# Patient Record
Sex: Female | Born: 1935 | Race: White | Hispanic: No | Smoking: Never smoker
Health system: Southern US, Community
[De-identification: ages and names within clinical notes are randomized; demographics above are authoritative.]

## PROBLEM LIST (undated history)

## (undated) DIAGNOSIS — N189 Chronic kidney disease, unspecified: Secondary | ICD-10-CM

## (undated) DIAGNOSIS — H409 Unspecified glaucoma: Secondary | ICD-10-CM

## (undated) DIAGNOSIS — E039 Hypothyroidism, unspecified: Secondary | ICD-10-CM

## (undated) DIAGNOSIS — I2510 Atherosclerotic heart disease of native coronary artery without angina pectoris: Secondary | ICD-10-CM

## (undated) DIAGNOSIS — I10 Essential (primary) hypertension: Secondary | ICD-10-CM

## (undated) DIAGNOSIS — M810 Age-related osteoporosis without current pathological fracture: Secondary | ICD-10-CM

## (undated) DIAGNOSIS — C801 Malignant (primary) neoplasm, unspecified: Secondary | ICD-10-CM

## (undated) DIAGNOSIS — M1990 Unspecified osteoarthritis, unspecified site: Secondary | ICD-10-CM

## (undated) HISTORY — PX: EYE SURGERY: SHX253

## (undated) HISTORY — PX: ABDOMINAL HYSTERECTOMY: SHX81

## (undated) HISTORY — PX: THYROID SURGERY: SHX805

---

## 2005-11-13 ENCOUNTER — Ambulatory Visit: Payer: Self-pay | Admitting: Internal Medicine

## 2006-01-02 ENCOUNTER — Ambulatory Visit: Payer: Self-pay | Admitting: Internal Medicine

## 2006-01-31 ENCOUNTER — Ambulatory Visit: Payer: Self-pay | Admitting: Internal Medicine

## 2006-08-09 ENCOUNTER — Ambulatory Visit: Payer: Self-pay | Admitting: Internal Medicine

## 2007-01-06 ENCOUNTER — Ambulatory Visit: Payer: Self-pay | Admitting: Internal Medicine

## 2008-02-06 ENCOUNTER — Ambulatory Visit: Payer: Self-pay | Admitting: Internal Medicine

## 2008-10-22 HISTORY — PX: CATARACT EXTRACTION W/ INTRAOCULAR LENS IMPLANT: SHX1309

## 2009-02-07 ENCOUNTER — Ambulatory Visit: Payer: Self-pay | Admitting: Internal Medicine

## 2010-02-08 ENCOUNTER — Ambulatory Visit: Payer: Self-pay | Admitting: Internal Medicine

## 2011-02-12 ENCOUNTER — Ambulatory Visit: Payer: Self-pay | Admitting: Internal Medicine

## 2012-02-18 ENCOUNTER — Ambulatory Visit: Payer: Self-pay | Admitting: Internal Medicine

## 2013-06-19 ENCOUNTER — Ambulatory Visit: Payer: Self-pay | Admitting: Internal Medicine

## 2013-10-25 ENCOUNTER — Emergency Department: Payer: Self-pay | Admitting: Emergency Medicine

## 2013-10-25 LAB — BASIC METABOLIC PANEL
Anion Gap: 6 — ABNORMAL LOW (ref 7–16)
BUN: 31 mg/dL — ABNORMAL HIGH (ref 7–18)
Calcium, Total: 9.2 mg/dL (ref 8.5–10.1)
Chloride: 103 mmol/L (ref 98–107)
Co2: 26 mmol/L (ref 21–32)
Creatinine: 1.26 mg/dL (ref 0.60–1.30)
EGFR (African American): 47 — ABNORMAL LOW
EGFR (Non-African Amer.): 41 — ABNORMAL LOW
Glucose: 114 mg/dL — ABNORMAL HIGH (ref 65–99)
Osmolality: 278 (ref 275–301)
Potassium: 3.8 mmol/L (ref 3.5–5.1)
SODIUM: 135 mmol/L — AB (ref 136–145)

## 2013-10-25 LAB — CBC
HCT: 33.1 % — ABNORMAL LOW (ref 35.0–47.0)
HGB: 11.3 g/dL — ABNORMAL LOW (ref 12.0–16.0)
MCH: 31.5 pg (ref 26.0–34.0)
MCHC: 34.3 g/dL (ref 32.0–36.0)
MCV: 92 fL (ref 80–100)
Platelet: 186 10*3/uL (ref 150–440)
RBC: 3.59 10*6/uL — AB (ref 3.80–5.20)
RDW: 13.6 % (ref 11.5–14.5)
WBC: 7.2 10*3/uL (ref 3.6–11.0)

## 2013-10-25 LAB — TROPONIN I: Troponin-I: <0.02 ng/mL

## 2014-06-21 ENCOUNTER — Ambulatory Visit: Payer: Self-pay | Admitting: Internal Medicine

## 2015-10-23 HISTORY — PX: MOUTH SURGERY: SHX715

## 2015-12-21 DIAGNOSIS — Z01812 Encounter for preprocedural laboratory examination: Secondary | ICD-10-CM | POA: Diagnosis present

## 2015-12-21 DIAGNOSIS — Z01810 Encounter for preprocedural cardiovascular examination: Secondary | ICD-10-CM | POA: Diagnosis not present

## 2015-12-21 LAB — URINALYSIS COMPLETE WITH MICROSCOPIC (ARMC ONLY)
BILIRUBIN URINE: NEGATIVE
Bacteria, UA: NONE SEEN
GLUCOSE, UA: NEGATIVE mg/dL
HGB URINE DIPSTICK: NEGATIVE
Ketones, ur: NEGATIVE mg/dL
Nitrite: NEGATIVE
Protein, ur: NEGATIVE mg/dL
SPECIFIC GRAVITY, URINE: 1.009 (ref 1.005–1.030)
pH: 5 (ref 5.0–8.0)

## 2015-12-21 LAB — CBC
HCT: 34.9 % — ABNORMAL LOW (ref 35.0–47.0)
Hemoglobin: 12.2 g/dL (ref 12.0–16.0)
MCH: 30.8 pg (ref 26.0–34.0)
MCHC: 34.9 g/dL (ref 32.0–36.0)
MCV: 88.4 fL (ref 80.0–100.0)
PLATELETS: 229 10*3/uL (ref 150–440)
RBC: 3.94 MIL/uL (ref 3.80–5.20)
RDW: 13.1 % (ref 11.5–14.5)
WBC: 9.5 10*3/uL (ref 3.6–11.0)

## 2015-12-21 LAB — ABO/RH: ABO/RH(D): A POS

## 2015-12-21 LAB — PROTIME-INR
INR: 1.15
PROTHROMBIN TIME: 14.9 s (ref 11.4–15.0)

## 2015-12-21 LAB — TYPE AND SCREEN
ABO/RH(D): A POS
Antibody Screen: NEGATIVE

## 2015-12-21 LAB — BASIC METABOLIC PANEL
ANION GAP: 6 (ref 5–15)
BUN: 31 mg/dL — ABNORMAL HIGH (ref 6–20)
CALCIUM: 9.3 mg/dL (ref 8.9–10.3)
CO2: 27 mmol/L (ref 22–32)
Chloride: 102 mmol/L (ref 101–111)
Creatinine, Ser: 1.3 mg/dL — ABNORMAL HIGH (ref 0.44–1.00)
GFR, EST AFRICAN AMERICAN: 44 mL/min — AB (ref >60)
GFR, EST NON AFRICAN AMERICAN: 38 mL/min — AB (ref >60)
GLUCOSE: 88 mg/dL (ref 65–99)
Potassium: 3.9 mmol/L (ref 3.5–5.1)
SODIUM: 135 mmol/L (ref 135–145)

## 2015-12-21 LAB — SEDIMENTATION RATE: SED RATE: 1 mm/h (ref 0–30)

## 2015-12-21 LAB — SURGICAL PCR SCREEN
MRSA, PCR: POSITIVE — AB
STAPHYLOCOCCUS AUREUS: POSITIVE — AB

## 2015-12-21 LAB — APTT: APTT: 34 s (ref 24–36)

## 2015-12-21 NOTE — Pre-Procedure Instructions (Signed)
MRSA and Staph Aureus results positive, faxed to Dr. Clydell Hakim office/[place] for review and treatment as indicated.

## 2015-12-21 NOTE — Patient Instructions (Signed)
  Your procedure is scheduled on: Monday January 02, 2016. Report to Same Day Surgery. To find out your arrival time please call [phone] between 1PM - 3PM on Friday December 30, 2015.  Remember: Instructions that are not followed completely may result in serious medical risk, up to and including death, or upon the discretion of your surgeon and anesthesiologist your surgery may need to be rescheduled.    _x___ 1. Do not eat food or drink liquids after midnight. No gum chewing or hard candies.     ____ 2. No Alcohol for 24 hours before or after surgery.   ____ 3. Bring all medications with you on the day of surgery if instructed.    __x__ 4. Notify your doctor if there is any change in your medical condition     (cold, fever, infections).     Do not wear jewelry, make-up, hairpins, clips or nail polish.  Do not wear lotions, powders, or perfumes. You may wear deodorant.  Do not shave 48 hours prior to surgery. Men may shave face and neck.  Do not bring valuables to the hospital.    [facility] is not responsible for any belongings or valuables.               Contacts, dentures or bridgework may not be worn into surgery.  Leave your suitcase in the car. After surgery it may be brought to your room.  For patients admitted to the hospital, discharge time is determined by your treatment team.   Patients discharged the day of surgery will not be allowed to drive home.    Please read over the following fact sheets that you were given:   [facility] Preparing for Surgery  _x___ Take these medicines the morning of surgery with A SIP OF WATER:    1. liothyronine (CYTOMEL)    ____ Fleet Enema (as directed)   _x___ Use CHG Soap as directed  ____ Use inhalers on the day of surgery  ____ Stop metformin 2 days prior to surgery    ____ Take 1/2 of usual insulin dose the night before surgery and none on the morning of  surgery.   _x__ Stop aspirin on December 26, 2015.  _x___ Stop  Anti-inflammatories December 26, 2015 including Voltaren gel.   _x___ Stop supplements vitamins and vitamin D until after surgery.    ____ Bring C-Pap to the hospital.

## 2015-12-22 ENCOUNTER — Encounter
Admission: RE | Admit: 2015-12-22 | Discharge: 2015-12-22 | Disposition: A | Discharge status: Home / Self Care | Payer: Medicare Other | Source: Ambulatory Visit | Attending: Orthopedic Surgery | Admitting: Orthopedic Surgery

## 2015-12-22 DIAGNOSIS — Z01810 Encounter for preprocedural cardiovascular examination: Secondary | ICD-10-CM | POA: Diagnosis not present

## 2015-12-22 DIAGNOSIS — Z01812 Encounter for preprocedural laboratory examination: Secondary | ICD-10-CM | POA: Insufficient documentation

## 2015-12-22 HISTORY — DX: Unspecified osteoarthritis, unspecified site: M19.90

## 2015-12-22 HISTORY — DX: Hypothyroidism, unspecified: E03.9

## 2015-12-22 HISTORY — DX: Unspecified glaucoma: H40.9

## 2015-12-22 HISTORY — DX: Atherosclerotic heart disease of native coronary artery without angina pectoris: I25.10

## 2015-12-22 HISTORY — DX: Chronic kidney disease, unspecified: N18.9

## 2015-12-22 HISTORY — DX: Essential (primary) hypertension: I10

## 2015-12-22 NOTE — Pre-Procedure Instructions (Signed)
Spoke with Dr. Clydell Hakim nurse Margaretha Sheffield, informed her of + MRSA and Staph Aureus nasal swab results and EKG changes found on today's EKG compared to one done on 10/23/13.  Both EKG's are being faxed to Dr. Jerilynn Mages. Anderson for comparison  and ok to proceed.

## 2015-12-22 NOTE — Pre-Procedure Instructions (Signed)
EKG from today has changes compared to EKG done 10/25/2013.  Dr. Frazier Richards has seen and cleared pt on 12/13/15.  Dr. Ronelle Nigh requests that the 2 EKG's be faxed to Dr. Jerilynn Mages. Anderson for him to look at/compare and ask him if we can proceed.

## 2015-12-23 LAB — URINE CULTURE: SPECIAL REQUESTS: NORMAL

## 2015-12-26 NOTE — Pre-Procedure Instructions (Signed)
CLEARED BY DR Ouida Sills AFTER SEEING PATIENT AND REVIEWING EKG 12/26/15.OK TO PROCEED IF DENTAL ABSCESS CLEARED IN TIME

## 2016-02-06 ENCOUNTER — Other Ambulatory Visit: Payer: Medicare Other

## 2016-02-13 ENCOUNTER — Encounter: Admission: RE | Payer: Self-pay | Source: Ambulatory Visit

## 2016-02-13 ENCOUNTER — Inpatient Hospital Stay: Admission: RE | Admit: 2016-02-13 | Payer: Medicare Other | Source: Ambulatory Visit | Admitting: Orthopedic Surgery

## 2016-02-13 SURGERY — ARTHROPLASTY, KNEE, TOTAL, USING IMAGELESS COMPUTER-ASSISTED NAVIGATION
Anesthesia: Choice | Laterality: Right

## 2016-06-07 ENCOUNTER — Encounter
Admission: RE | Admit: 2016-06-07 | Discharge: 2016-06-07 | Disposition: A | Discharge status: Home / Self Care | Payer: Medicare Other | Source: Ambulatory Visit | Attending: Orthopedic Surgery | Admitting: Orthopedic Surgery

## 2016-06-07 DIAGNOSIS — Z01812 Encounter for preprocedural laboratory examination: Secondary | ICD-10-CM | POA: Insufficient documentation

## 2016-06-07 HISTORY — DX: Age-related osteoporosis without current pathological fracture: M81.0

## 2016-06-07 LAB — PROTIME-INR
INR: 1.07
PROTHROMBIN TIME: 13.9 s (ref 11.4–15.2)

## 2016-06-07 LAB — COMPREHENSIVE METABOLIC PANEL
ALBUMIN: 4.7 g/dL (ref 3.5–5.0)
ALT: 12 U/L — ABNORMAL LOW (ref 14–54)
AST: 25 U/L (ref 15–41)
Alkaline Phosphatase: 54 U/L (ref 38–126)
Anion gap: 12 (ref 5–15)
BILIRUBIN TOTAL: 0.9 mg/dL (ref 0.3–1.2)
BUN: 36 mg/dL — AB (ref 6–20)
CO2: 23 mmol/L (ref 22–32)
Calcium: 9.9 mg/dL (ref 8.9–10.3)
Chloride: 102 mmol/L (ref 101–111)
Creatinine, Ser: 1.26 mg/dL — ABNORMAL HIGH (ref 0.44–1.00)
GFR calc Af Amer: 45 mL/min — ABNORMAL LOW (ref >60)
GFR calc non Af Amer: 39 mL/min — ABNORMAL LOW (ref >60)
GLUCOSE: 83 mg/dL (ref 65–99)
POTASSIUM: 4 mmol/L (ref 3.5–5.1)
SODIUM: 137 mmol/L (ref 135–145)
TOTAL PROTEIN: 8.4 g/dL — AB (ref 6.5–8.1)

## 2016-06-07 LAB — TYPE AND SCREEN
ABO/RH(D): A POS
ANTIBODY SCREEN: NEGATIVE

## 2016-06-07 LAB — CBC
HEMATOCRIT: 36.8 % (ref 35.0–47.0)
Hemoglobin: 12.6 g/dL (ref 12.0–16.0)
MCH: 30.9 pg (ref 26.0–34.0)
MCHC: 34.3 g/dL (ref 32.0–36.0)
MCV: 90.2 fL (ref 80.0–100.0)
PLATELETS: 202 10*3/uL (ref 150–440)
RBC: 4.09 MIL/uL (ref 3.80–5.20)
RDW: 13.2 % (ref 11.5–14.5)
WBC: 6.7 10*3/uL (ref 3.6–11.0)

## 2016-06-07 LAB — URINALYSIS COMPLETE WITH MICROSCOPIC (ARMC ONLY)
BACTERIA UA: NONE SEEN
BILIRUBIN URINE: NEGATIVE
GLUCOSE, UA: NEGATIVE mg/dL
Ketones, ur: NEGATIVE mg/dL
NITRITE: NEGATIVE
Protein, ur: NEGATIVE mg/dL
Specific Gravity, Urine: 1.005 (ref 1.005–1.030)
pH: 6 (ref 5.0–8.0)

## 2016-06-07 LAB — SURGICAL PCR SCREEN
MRSA, PCR: NEGATIVE
Staphylococcus aureus: NEGATIVE

## 2016-06-07 LAB — APTT: aPTT: 34 seconds (ref 24–36)

## 2016-06-07 LAB — SEDIMENTATION RATE: Sed Rate: 41 mm/hr — ABNORMAL HIGH (ref 0–30)

## 2016-06-07 NOTE — Patient Instructions (Signed)
  Your procedure is scheduled on:06/18/16 Monday Report to Day Surgery. To find out your arrival time please call [phone] between 1PM - 3PM on Friday 06/15/16  Remember: Instructions that are not followed completely may result in serious medical risk, up to and including death, or upon the discretion of your surgeon and anesthesiologist your surgery may need to be rescheduled.    __x__ 1. Do not eat food or drink liquids after midnight. No gum chewing or hard candies.     __x__ 2. No Alcohol for 24 hours before or after surgery.   ____ 3. Do Not Smoke For 24 Hours Prior to Your Surgery.   ____ 4. Bring all medications with you on the day of surgery if instructed.    __x__ 5. Notify your doctor if there is any change in your medical condition     (cold, fever, infections).       Do not wear jewelry, make-up, hairpins, clips or nail polish.  Do not wear lotions, powders, or perfumes. You may wear deodorant.  Do not shave 48 hours prior to surgery. Men may shave face and neck.  Do not bring valuables to the hospital.    [facility] is not responsible for any belongings or valuables.               Contacts, dentures or bridgework may not be worn into surgery.  Leave your suitcase in the car. After surgery it may be brought to your room.  For patients admitted to the hospital, discharge time is determined by your                treatment team.   Patients discharged the day of surgery will not be allowed to drive home.   Please read over the following fact sheets that you were given:   MRSA Information and Surgical Site Infection Prevention   ____ Take these medicines the morning of surgery with A SIP OF WATER:    1. acetaminophen (TYLENOL) 325 MG tablet if in pain  2. liothyronine (CYTOMEL) 25 MCG tablet   3. losartan-hydrochlorothiazide (HYZAAR) 50-12.5 MG tablet bring with you to the hospital  4.  5.  6.  ____ Fleet Enema (as directed)   __x__ Use CHG Soap as  directed  ____ Use inhalers on the day of surgery  ____ Stop metformin 2 days prior to surgery    ____ Take 1/2 of usual insulin dose the night before surgery and none on the morning of surgery.   _x__ Stop aspirin on 8/21 Monday  ____ Stop Anti-inflammatories on    ____ Stop supplements until after surgery.    ____ Bring C-Pap to the hospital.

## 2016-06-07 NOTE — Pre-Procedure Instructions (Signed)
Heather Colon at Dr. Clydell Hakim office was notified that there were EKG changes noted in March 2017 before the patient's surgery was cancelled due to oral surgery needed to resolve an infection.  No clearance was received at that time related to the EKG changes.  Patient sees Dr. Ouida Sills as PCP and does not have a cardiologist.  Request clearance from Dr. Ouida Sills per fax and faxed the notice of need for clearance to Dr. Clydell Hakim office.  Also requested a clearance from Dr. Nada Boozer DDS to state that her dental health would allow for surgery.

## 2016-06-08 LAB — URINE CULTURE: CULTURE: NO GROWTH

## 2016-06-11 NOTE — Pre-Procedure Instructions (Addendum)
REFAXED MEDICAL CLEARANCE REQUEST/EKG TO DR ANDERSON'S AND SPOKE WITH ASHLEY. HAD BEEN FAXED ON 06/07/16.  RECEIVED CLEARANCE FROM DR MIKE BLANKENSHIP/DENTIST WHO CLEARED LOW RISK FROM HIS STANDPOINT

## 2016-06-12 NOTE — Pre-Procedure Instructions (Signed)
CLEARED BY DR Ouida Sills AT 05/08/16 VISIT.

## 2016-06-18 ENCOUNTER — Encounter: Payer: Self-pay | Admitting: Orthopedic Surgery

## 2016-06-18 ENCOUNTER — Inpatient Hospital Stay: Payer: Medicare Other | Admitting: Anesthesiology

## 2016-06-18 ENCOUNTER — Inpatient Hospital Stay: Payer: Medicare Other

## 2016-06-18 ENCOUNTER — Encounter
Admission: RE | Disposition: A | Discharge status: Home Health | Payer: Self-pay | Source: Ambulatory Visit | Attending: Orthopedic Surgery

## 2016-06-18 ENCOUNTER — Inpatient Hospital Stay
Admission: RE | Admit: 2016-06-18 | Discharge: 2016-06-20 | DRG: 470 | Disposition: A | Discharge status: Home Health | Payer: Medicare Other | Source: Ambulatory Visit | Attending: Orthopedic Surgery | Admitting: Orthopedic Surgery

## 2016-06-18 DIAGNOSIS — Z7982 Long term (current) use of aspirin: Secondary | ICD-10-CM

## 2016-06-18 DIAGNOSIS — E7800 Pure hypercholesterolemia, unspecified: Secondary | ICD-10-CM | POA: Diagnosis present

## 2016-06-18 DIAGNOSIS — Z7983 Long term (current) use of bisphosphonates: Secondary | ICD-10-CM | POA: Diagnosis not present

## 2016-06-18 DIAGNOSIS — I2510 Atherosclerotic heart disease of native coronary artery without angina pectoris: Secondary | ICD-10-CM | POA: Diagnosis present

## 2016-06-18 DIAGNOSIS — Z96659 Presence of unspecified artificial knee joint: Secondary | ICD-10-CM

## 2016-06-18 DIAGNOSIS — N183 Chronic kidney disease, stage 3 (moderate): Secondary | ICD-10-CM | POA: Diagnosis present

## 2016-06-18 DIAGNOSIS — D62 Acute posthemorrhagic anemia: Secondary | ICD-10-CM | POA: Diagnosis not present

## 2016-06-18 DIAGNOSIS — M170 Bilateral primary osteoarthritis of knee: Principal | ICD-10-CM | POA: Diagnosis present

## 2016-06-18 DIAGNOSIS — Z79899 Other long term (current) drug therapy: Secondary | ICD-10-CM | POA: Diagnosis not present

## 2016-06-18 DIAGNOSIS — E039 Hypothyroidism, unspecified: Secondary | ICD-10-CM | POA: Diagnosis present

## 2016-06-18 DIAGNOSIS — M810 Age-related osteoporosis without current pathological fracture: Secondary | ICD-10-CM | POA: Diagnosis present

## 2016-06-18 DIAGNOSIS — I6529 Occlusion and stenosis of unspecified carotid artery: Secondary | ICD-10-CM | POA: Diagnosis present

## 2016-06-18 DIAGNOSIS — I129 Hypertensive chronic kidney disease with stage 1 through stage 4 chronic kidney disease, or unspecified chronic kidney disease: Secondary | ICD-10-CM | POA: Diagnosis present

## 2016-06-18 DIAGNOSIS — H409 Unspecified glaucoma: Secondary | ICD-10-CM | POA: Diagnosis present

## 2016-06-18 DIAGNOSIS — M1711 Unilateral primary osteoarthritis, right knee: Secondary | ICD-10-CM | POA: Diagnosis present

## 2016-06-18 HISTORY — PX: KNEE ARTHROPLASTY: SHX992

## 2016-06-18 SURGERY — ARTHROPLASTY, KNEE, TOTAL, USING IMAGELESS COMPUTER-ASSISTED NAVIGATION
Anesthesia: Spinal | Site: Knee | Laterality: Right | Wound class: Clean

## 2016-06-18 MED ORDER — ACETAMINOPHEN 10 MG/ML IV SOLN
INTRAVENOUS | Status: AC
  Filled 2016-06-18: qty 100

## 2016-06-18 MED ORDER — SODIUM CHLORIDE FLUSH 0.9 % IV SOLN
INTRAVENOUS | Status: AC
  Filled 2016-06-18: qty 10

## 2016-06-18 MED ORDER — ENOXAPARIN SODIUM 30 MG/0.3ML ~~LOC~~ SOLN
30.0000 mg | Freq: Two times a day (BID) | SUBCUTANEOUS | Status: DC
  Administered 2016-06-19 – 2016-06-20 (×2): 30 mg via SUBCUTANEOUS
  Filled 2016-06-18 (×2): qty 0.3

## 2016-06-18 MED ORDER — DIPHENHYDRAMINE HCL 12.5 MG/5ML PO ELIX
12.5000 mg | ORAL_SOLUTION | ORAL | Status: DC | PRN
Start: 2016-06-18 — End: 2016-06-20

## 2016-06-18 MED ORDER — SODIUM CHLORIDE 0.9 % IJ SOLN
INTRAMUSCULAR | Status: AC
  Filled 2016-06-18: qty 50

## 2016-06-18 MED ORDER — CEFAZOLIN SODIUM-DEXTROSE 2-4 GM/100ML-% IV SOLN
2.0000 g | Freq: Once | INTRAVENOUS | Status: AC
  Administered 2016-06-18: 2 g via INTRAVENOUS

## 2016-06-18 MED ORDER — BUPIVACAINE HCL (PF) 0.5 % IJ SOLN
INTRAMUSCULAR | Status: DC | PRN
  Administered 2016-06-18: 1.5 mL

## 2016-06-18 MED ORDER — LOSARTAN POTASSIUM-HCTZ 50-12.5 MG PO TABS: 1.0000 | ORAL_TABLET | ORAL | Status: DC

## 2016-06-18 MED ORDER — BUPIVACAINE HCL (PF) 0.25 % IJ SOLN
INTRAMUSCULAR | Status: AC
  Filled 2016-06-18: qty 30

## 2016-06-18 MED ORDER — LIOTHYRONINE SODIUM 25 MCG PO TABS: 12.5000 ug | ORAL_TABLET | ORAL | Status: DC

## 2016-06-18 MED ORDER — METOCLOPRAMIDE HCL 10 MG PO TABS: 10.0000 mg | ORAL_TABLET | Freq: Three times a day (TID) | ORAL | Status: DC

## 2016-06-18 MED ORDER — SODIUM CHLORIDE 0.9 % IV SOLN
INTRAVENOUS | Status: DC | PRN
  Administered 2016-06-18: 15 ug/min via INTRAVENOUS

## 2016-06-18 MED ORDER — PROPOFOL 500 MG/50ML IV EMUL
INTRAVENOUS | Status: DC | PRN
  Administered 2016-06-18 (×2): 20 ug/kg/min via INTRAVENOUS

## 2016-06-18 MED ORDER — PHENOL 1.4 % MT LIQD
1.0000 | OROMUCOSAL | Status: DC | PRN
  Filled 2016-06-18: qty 177

## 2016-06-18 MED ORDER — LATANOPROST 0.005 % OP SOLN
1.0000 [drp] | Freq: Every day | OPHTHALMIC | Status: DC
  Administered 2016-06-18: 1 [drp] via OPHTHALMIC
  Filled 2016-06-18: qty 2.5

## 2016-06-18 MED ORDER — BISACODYL 10 MG RE SUPP
10.0000 mg | Freq: Every day | RECTAL | Status: DC | PRN
  Administered 2016-06-20: 10 mg via RECTAL
  Filled 2016-06-18: qty 1

## 2016-06-18 MED ORDER — NEOMYCIN-POLYMYXIN B GU 40-200000 IR SOLN
Status: DC | PRN
  Administered 2016-06-18: 14 mL

## 2016-06-18 MED ORDER — MORPHINE SULFATE (PF) 2 MG/ML IV SOLN
2.0000 mg | INTRAVENOUS | Status: DC | PRN
  Administered 2016-06-18: 2 mg via INTRAVENOUS
  Filled 2016-06-18: qty 1

## 2016-06-18 MED ORDER — IBANDRONATE SODIUM 150 MG PO TABS
150.0000 mg | ORAL_TABLET | ORAL | Status: DC
  Filled 2016-06-18: qty 1

## 2016-06-18 MED ORDER — TRAMADOL HCL 50 MG PO TABS
50.0000 mg | ORAL_TABLET | ORAL | Status: DC | PRN
Start: 2016-06-18 — End: 2016-06-20
  Administered 2016-06-18: 100 mg via ORAL
  Administered 2016-06-20: 50 mg via ORAL
  Filled 2016-06-18: qty 2
  Filled 2016-06-18: qty 1

## 2016-06-18 MED ORDER — PROPOFOL 10 MG/ML IV BOLUS
INTRAVENOUS | Status: DC | PRN
  Administered 2016-06-18: 20 mg via INTRAVENOUS

## 2016-06-18 MED ORDER — ONDANSETRON HCL 4 MG/2ML IJ SOLN
INTRAMUSCULAR | Status: AC
  Filled 2016-06-18: qty 2

## 2016-06-18 MED ORDER — ACETAMINOPHEN 10 MG/ML IV SOLN
1000.0000 mg | Freq: Four times a day (QID) | INTRAVENOUS | Status: AC
  Administered 2016-06-18 – 2016-06-19 (×4): 1000 mg via INTRAVENOUS
  Filled 2016-06-18 (×4): qty 100

## 2016-06-18 MED ORDER — SODIUM CHLORIDE 0.9 % IV SOLN
INTRAVENOUS | Status: DC | PRN
  Administered 2016-06-18: 1000 mg via INTRAVENOUS

## 2016-06-18 MED ORDER — TRANEXAMIC ACID 1000 MG/10ML IV SOLN
1000.0000 mg | Freq: Once | INTRAVENOUS | Status: AC
  Administered 2016-06-18: 1000 mg via INTRAVENOUS
  Filled 2016-06-18: qty 10

## 2016-06-18 MED ORDER — KETAMINE HCL 50 MG/ML IJ SOLN
INTRAMUSCULAR | Status: DC | PRN
Start: 2016-06-18 — End: 2016-06-18
  Administered 2016-06-18: 2 mg via INTRAMUSCULAR

## 2016-06-18 MED ORDER — LOSARTAN POTASSIUM 50 MG PO TABS
50.0000 mg | ORAL_TABLET | Freq: Every day | ORAL | Status: DC
  Administered 2016-06-18 – 2016-06-20 (×3): 50 mg via ORAL
  Filled 2016-06-18 (×3): qty 1

## 2016-06-18 MED ORDER — ADULT MULTIVITAMIN W/MINERALS CH
1.0000 | ORAL_TABLET | ORAL | Status: DC
  Administered 2016-06-19 – 2016-06-20 (×2): 1 via ORAL
  Filled 2016-06-18 (×2): qty 1

## 2016-06-18 MED ORDER — FENTANYL CITRATE (PF) 100 MCG/2ML IJ SOLN
25.0000 ug | INTRAMUSCULAR | Status: DC | PRN
Start: 2016-06-18 — End: 2016-06-18
  Administered 2016-06-18 (×2): 50 ug via INTRAVENOUS

## 2016-06-18 MED ORDER — SODIUM CHLORIDE 0.9 % IV SOLN
INTRAVENOUS | Status: DC | PRN
  Administered 2016-06-18: .2 ug/kg/min via INTRAVENOUS

## 2016-06-18 MED ORDER — FAMOTIDINE 20 MG PO TABS
20.0000 mg | ORAL_TABLET | Freq: Once | ORAL | Status: AC
  Administered 2016-06-18: 20 mg via ORAL

## 2016-06-18 MED ORDER — ACETAMINOPHEN 10 MG/ML IV SOLN
INTRAVENOUS | Status: DC | PRN
  Administered 2016-06-18: 1000 mg via INTRAVENOUS

## 2016-06-18 MED ORDER — FENTANYL CITRATE (PF) 100 MCG/2ML IJ SOLN
INTRAMUSCULAR | Status: AC
  Filled 2016-06-18: qty 2

## 2016-06-18 MED ORDER — SODIUM CHLORIDE 0.9 % IV SOLN
INTRAVENOUS | Status: DC | PRN
  Administered 2016-06-18: 60 mL

## 2016-06-18 MED ORDER — HYDROCHLOROTHIAZIDE 12.5 MG PO CAPS
12.5000 mg | ORAL_CAPSULE | Freq: Every day | ORAL | Status: DC
  Administered 2016-06-18 – 2016-06-20 (×3): 12.5 mg via ORAL
  Filled 2016-06-18 (×3): qty 1

## 2016-06-18 MED ORDER — FENTANYL CITRATE (PF) 100 MCG/2ML IJ SOLN
INTRAMUSCULAR | Status: DC | PRN
  Administered 2016-06-18 (×2): 25 ug via INTRAVENOUS
  Administered 2016-06-18: 50 ug via INTRAVENOUS

## 2016-06-18 MED ORDER — ONDANSETRON HCL 4 MG PO TABS: 4.0000 mg | ORAL_TABLET | Freq: Four times a day (QID) | ORAL | Status: DC | PRN

## 2016-06-18 MED ORDER — ALUM & MAG HYDROXIDE-SIMETH 200-200-20 MG/5ML PO SUSP: 30.0000 mL | ORAL | Status: DC | PRN

## 2016-06-18 MED ORDER — LIOTHYRONINE SODIUM 25 MCG PO TABS
25.0000 ug | ORAL_TABLET | Freq: Every day | ORAL | Status: DC
  Administered 2016-06-19 – 2016-06-20 (×2): 25 ug via ORAL
  Filled 2016-06-18 (×3): qty 1

## 2016-06-18 MED ORDER — POLYVINYL ALCOHOL 1.4 % OP SOLN
1.0000 [drp] | Freq: Every day | OPHTHALMIC | Status: DC | PRN
  Filled 2016-06-18: qty 15

## 2016-06-18 MED ORDER — MENTHOL 3 MG MT LOZG
1.0000 | LOZENGE | OROMUCOSAL | Status: DC | PRN
  Filled 2016-06-18: qty 9

## 2016-06-18 MED ORDER — PNEUMOCOCCAL VAC POLYVALENT 25 MCG/0.5ML IJ INJ
0.5000 mL | INJECTION | INTRAMUSCULAR | Status: DC
  Filled 2016-06-18: qty 0.5

## 2016-06-18 MED ORDER — TETRACAINE HCL 1 % IJ SOLN
INTRAMUSCULAR | Status: DC | PRN
Start: 2016-06-18 — End: 2016-06-18
  Administered 2016-06-18: 10 mg via INTRASPINAL

## 2016-06-18 MED ORDER — MIDAZOLAM HCL 5 MG/5ML IJ SOLN
INTRAMUSCULAR | Status: DC | PRN
  Administered 2016-06-18 (×2): 0.5 mg via INTRAVENOUS

## 2016-06-18 MED ORDER — FAMOTIDINE 20 MG PO TABS
ORAL_TABLET | ORAL | Status: AC
  Administered 2016-06-18: 20 mg via ORAL
  Filled 2016-06-18: qty 1

## 2016-06-18 MED ORDER — VITAMIN D 1000 UNITS PO TABS
2000.0000 [IU] | ORAL_TABLET | ORAL | Status: DC
  Administered 2016-06-19 – 2016-06-20 (×2): 2000 [IU] via ORAL
  Filled 2016-06-18 (×2): qty 2

## 2016-06-18 MED ORDER — VERAPAMIL HCL ER 200 MG PO CP24: 1.0000 | ORAL_CAPSULE | Freq: Every day | ORAL | Status: DC

## 2016-06-18 MED ORDER — TRANEXAMIC ACID 1000 MG/10ML IV SOLN
1000.0000 mg | INTRAVENOUS | Status: DC
  Filled 2016-06-18: qty 10

## 2016-06-18 MED ORDER — LACTATED RINGERS IV SOLN
INTRAVENOUS | Status: DC
  Administered 2016-06-18: 07:00:00 via INTRAVENOUS

## 2016-06-18 MED ORDER — ONDANSETRON HCL 4 MG/2ML IJ SOLN
4.0000 mg | Freq: Once | INTRAMUSCULAR | Status: AC
  Administered 2016-06-18: 4 mg via INTRAVENOUS

## 2016-06-18 MED ORDER — PANTOPRAZOLE SODIUM 40 MG PO TBEC
40.0000 mg | DELAYED_RELEASE_TABLET | Freq: Two times a day (BID) | ORAL | Status: DC
  Administered 2016-06-18 – 2016-06-20 (×3): 40 mg via ORAL
  Filled 2016-06-18 (×3): qty 1

## 2016-06-18 MED ORDER — MAGNESIUM HYDROXIDE 400 MG/5ML PO SUSP: 30.0000 mL | Freq: Every day | ORAL | Status: DC | PRN

## 2016-06-18 MED ORDER — ACETAMINOPHEN 325 MG PO TABS
650.0000 mg | ORAL_TABLET | Freq: Four times a day (QID) | ORAL | Status: DC | PRN
  Administered 2016-06-19 – 2016-06-20 (×2): 325 mg via ORAL
  Filled 2016-06-18: qty 1

## 2016-06-18 MED ORDER — FLEET ENEMA 7-19 GM/118ML RE ENEM: 1.0000 | ENEMA | Freq: Once | RECTAL | Status: DC | PRN

## 2016-06-18 MED ORDER — METOCLOPRAMIDE HCL 5 MG/ML IJ SOLN
5.0000 mg | Freq: Four times a day (QID) | INTRAMUSCULAR | Status: DC | PRN
  Administered 2016-06-18: 5 mg via INTRAVENOUS
  Filled 2016-06-18: qty 2

## 2016-06-18 MED ORDER — CEFAZOLIN SODIUM-DEXTROSE 2-4 GM/100ML-% IV SOLN
INTRAVENOUS | Status: AC
  Filled 2016-06-18: qty 100

## 2016-06-18 MED ORDER — CEFAZOLIN SODIUM-DEXTROSE 2-4 GM/100ML-% IV SOLN
2.0000 g | Freq: Four times a day (QID) | INTRAVENOUS | Status: AC
  Administered 2016-06-18 – 2016-06-19 (×4): 2 g via INTRAVENOUS
  Filled 2016-06-18 (×4): qty 100

## 2016-06-18 MED ORDER — OXYCODONE HCL 5 MG/5ML PO SOLN: 5.0000 mg | Freq: Once | ORAL | Status: DC | PRN

## 2016-06-18 MED ORDER — OXYCODONE HCL 5 MG PO TABS: 5.0000 mg | ORAL_TABLET | Freq: Once | ORAL | Status: DC | PRN

## 2016-06-18 MED ORDER — FERROUS SULFATE 325 (65 FE) MG PO TABS
325.0000 mg | ORAL_TABLET | Freq: Two times a day (BID) | ORAL | Status: DC
  Administered 2016-06-18 – 2016-06-20 (×4): 325 mg via ORAL
  Filled 2016-06-18 (×4): qty 1

## 2016-06-18 MED ORDER — BUPIVACAINE HCL (PF) 0.25 % IJ SOLN
INTRAMUSCULAR | Status: DC | PRN
  Administered 2016-06-18: 60 mL

## 2016-06-18 MED ORDER — NEOMYCIN-POLYMYXIN B GU 40-200000 IR SOLN
Status: AC
  Filled 2016-06-18: qty 12

## 2016-06-18 MED ORDER — SENNOSIDES-DOCUSATE SODIUM 8.6-50 MG PO TABS
1.0000 | ORAL_TABLET | Freq: Two times a day (BID) | ORAL | Status: DC
  Administered 2016-06-18 – 2016-06-20 (×3): 1 via ORAL
  Filled 2016-06-18 (×3): qty 1

## 2016-06-18 MED ORDER — FLUOCINONIDE 0.05 % EX OINT
1.0000 "application " | TOPICAL_OINTMENT | Freq: Every day | CUTANEOUS | Status: DC | PRN
  Filled 2016-06-18: qty 15

## 2016-06-18 MED ORDER — SODIUM CHLORIDE 0.9 % IV SOLN
INTRAVENOUS | Status: DC
  Administered 2016-06-18 (×2): via INTRAVENOUS

## 2016-06-18 MED ORDER — ONDANSETRON HCL 4 MG/2ML IJ SOLN
4.0000 mg | Freq: Four times a day (QID) | INTRAMUSCULAR | Status: DC | PRN
  Administered 2016-06-18: 4 mg via INTRAVENOUS
  Filled 2016-06-18: qty 2

## 2016-06-18 MED ORDER — ACETAMINOPHEN 650 MG RE SUPP: 650.0000 mg | Freq: Four times a day (QID) | RECTAL | Status: DC | PRN

## 2016-06-18 MED ORDER — OXYCODONE HCL 5 MG PO TABS
5.0000 mg | ORAL_TABLET | ORAL | Status: DC | PRN
Start: 2016-06-18 — End: 2016-06-19
  Administered 2016-06-18 – 2016-06-19 (×3): 10 mg via ORAL
  Filled 2016-06-18 (×3): qty 2

## 2016-06-18 MED ORDER — BUPIVACAINE LIPOSOME 1.3 % IJ SUSP
INTRAMUSCULAR | Status: AC
  Filled 2016-06-18: qty 20

## 2016-06-18 SURGICAL SUPPLY — 62 items
AUTOTRANSFUS HAS 1/8 (MISCELLANEOUS) ×3
BATTERY INSTRU NAVIGATION (MISCELLANEOUS) ×12 IMPLANT
BLADE SAW 1 (BLADE) ×3 IMPLANT
BLADE SAW 1/2 (BLADE) ×3 IMPLANT
CANISTER SUCT 1200ML W/VALVE (MISCELLANEOUS) ×3 IMPLANT
CANISTER SUCT 3000ML (MISCELLANEOUS) ×6 IMPLANT
CAPT KNEE TOTAL 3 ATTUNE ×3 IMPLANT
CATH TRAY METER 16FR LF (MISCELLANEOUS) ×3 IMPLANT
CEMENT HV SMART SET (Cement) ×6 IMPLANT
COOLER POLAR GLACIER W/PUMP (MISCELLANEOUS) ×3 IMPLANT
CUFF TOURN 24 STER (MISCELLANEOUS) ×3 IMPLANT
CUFF TOURN 30 STER DUAL PORT (MISCELLANEOUS) IMPLANT
DRAPE SHEET LG 3/4 BI-LAMINATE (DRAPES) ×3 IMPLANT
DRSG DERMACEA 8X12 NADH (GAUZE/BANDAGES/DRESSINGS) ×3 IMPLANT
DRSG OPSITE POSTOP 4X14 (GAUZE/BANDAGES/DRESSINGS) ×3 IMPLANT
DRSG TEGADERM 4X4.75 (GAUZE/BANDAGES/DRESSINGS) ×3 IMPLANT
DURAPREP 26ML APPLICATOR (WOUND CARE) ×3 IMPLANT
ELECT CAUTERY BLADE 6.4 (BLADE) ×3 IMPLANT
ELECT REM PT RETURN 9FT ADLT (ELECTROSURGICAL) ×3
ELECTRODE REM PT RTRN 9FT ADLT (ELECTROSURGICAL) ×1 IMPLANT
EX-PIN ORTHOLOCK NAV 4X150 (PIN) ×6 IMPLANT
GLOVE BIO SURGEON STRL SZ7 (GLOVE) ×6 IMPLANT
GLOVE BIOGEL M STRL SZ7.5 (GLOVE) ×6 IMPLANT
GLOVE INDICATOR 7.5 STRL GRN (GLOVE) ×6 IMPLANT
GLOVE INDICATOR 8.0 STRL GRN (GLOVE) ×6 IMPLANT
GLOVE SURG 9.0 ORTHO LTXF (GLOVE) ×3 IMPLANT
GLOVE SURG ORTHO 9.0 STRL STRW (GLOVE) ×3 IMPLANT
GOWN STRL REUS W/ TWL LRG LVL3 (GOWN DISPOSABLE) ×3 IMPLANT
GOWN STRL REUS W/TWL 2XL LVL3 (GOWN DISPOSABLE) ×3 IMPLANT
GOWN STRL REUS W/TWL LRG LVL3 (GOWN DISPOSABLE) ×6
HANDPIECE INTERPULSE COAX TIP (DISPOSABLE) ×2
HOLDER FOLEY CATH W/STRAP (MISCELLANEOUS) ×3 IMPLANT
HOOD PEEL AWAY FLYTE STAYCOOL (MISCELLANEOUS) ×6 IMPLANT
KIT RM TURNOVER STRD PROC AR (KITS) ×3 IMPLANT
KNIFE SCULPS 14X20 (INSTRUMENTS) ×3 IMPLANT
LABEL OR SOLS (LABEL) ×3 IMPLANT
MARKER SKIN DUAL TIP RULER LAB (MISCELLANEOUS) ×3 IMPLANT
NDL SAFETY 18GX1.5 (NEEDLE) ×3 IMPLANT
NEEDLE SPNL 20GX3.5 QUINCKE YW (NEEDLE) ×3 IMPLANT
NS IRRIG 500ML POUR BTL (IV SOLUTION) ×3 IMPLANT
PACK TOTAL KNEE (MISCELLANEOUS) ×3 IMPLANT
PAD WRAPON POLAR KNEE (MISCELLANEOUS) ×1 IMPLANT
PIN DRILL QUICK PACK ×3 IMPLANT
PIN FIXATION 1/8DIA X 3INL (PIN) ×3 IMPLANT
SET HNDPC FAN SPRY TIP SCT (DISPOSABLE) ×1 IMPLANT
SOL .9 NS 3000ML IRR  AL (IV SOLUTION) ×2
SOL .9 NS 3000ML IRR UROMATIC (IV SOLUTION) ×1 IMPLANT
SOL PREP PVP 2OZ (MISCELLANEOUS) ×3
SOLUTION PREP PVP 2OZ (MISCELLANEOUS) ×1 IMPLANT
SPONGE DRAIN TRACH 4X4 STRL 2S (GAUZE/BANDAGES/DRESSINGS) ×3 IMPLANT
STAPLER SKIN PROX 35W (STAPLE) ×3 IMPLANT
SUCTION FRAZIER HANDLE 10FR (MISCELLANEOUS) ×4
SUCTION TUBE FRAZIER 10FR DISP (MISCELLANEOUS) ×2 IMPLANT
SUT VIC AB 0 CT1 36 (SUTURE) ×3 IMPLANT
SUT VIC AB 1 CT1 36 (SUTURE) ×6 IMPLANT
SUT VIC AB 2-0 CT2 27 (SUTURE) ×3 IMPLANT
SYR 20CC LL (SYRINGE) ×3 IMPLANT
SYR 30ML LL (SYRINGE) ×6 IMPLANT
SYSTEM AUTOTRANSFUS DUAL TROCR (MISCELLANEOUS) ×1 IMPLANT
TOWEL OR 17X26 4PK STRL BLUE (TOWEL DISPOSABLE) ×3 IMPLANT
TOWER CARTRIDGE SMART MIX (DISPOSABLE) ×3 IMPLANT
WRAPON POLAR PAD KNEE (MISCELLANEOUS) ×3

## 2016-06-18 NOTE — Evaluation (Signed)
Physical Therapy Evaluation Patient Details Name: Heather Colon MRN: [id number] DOB: 1932/12/17 Today's Date: 06/18/2016   History of Present Illness  Patient is POD#0 on 8/28 for R TKR   Clinical Impression  Patient seen on day of R TKR. Prior to this she has been quite independent, no use of ADs. She is quite motivated to work with therapy, able to complete 10 SLRs bilaterally. She is somewhat limited in ROM at this time, however this is more related to pain tolerance. She is rather independent with bed mobility and transfers, requiring only cga/min guard assist and cuing. She is able to take small steps and weight bear appropriately on RLE. She did have emesis episode at the conclusion of session once she ambulated to chair. Informed RN, patient has had several such episodes today. Patient preferred to stay in chair at this time. Patient otherwise is progressing well towards her mobility goals.     Follow Up Recommendations Home health PT    Equipment Recommendations  Rolling walker with 5" wheels    Recommendations for Other Services       Precautions / Restrictions Precautions Precautions: Fall Restrictions Weight Bearing Restrictions: Yes RLE Weight Bearing: Weight bearing as tolerated      Mobility  Bed Mobility Overal bed mobility: Needs Assistance Bed Mobility: Supine to Sit     Supine to sit: Supervision     General bed mobility comments: Educated to hook LLE underneath RLE, able to use UEs to complete transfer.   Transfers Overall transfer level: Needs assistance Equipment used: Rolling walker (2 wheeled) Transfers: Sit to/from Stand Sit to Stand: Min guard         General transfer comment: Patient able to stand briskly, no loss of balance noted.   Ambulation/Gait Ambulation/Gait assistance: Min guard Ambulation Distance (Feet): 8 Feet Assistive device: Rolling walker (2 wheeled) Gait Pattern/deviations: Step-to pattern;Decreased step length -  right;Decreased step length - left;Trunk flexed   Gait velocity interpretation: <1.8 ft/sec, indicative of risk for recurrent falls General Gait Details: Patient educated on sequencing, able to use UEs to offload RLE in [place], did note decreased step length but no buckling noted.   Stairs            Wheelchair Mobility    Modified Rankin (Stroke Patients Only)       Balance Overall balance assessment: Needs assistance Sitting-balance support: No upper extremity supported Sitting balance-Leahy Scale: Good     Standing balance support: Bilateral upper extremity supported Standing balance-Leahy Scale: Good                               Pertinent Vitals/Pain Pain Assessment: Faces Faces Pain Scale: Hurts a little bit Pain Location: R knee  Pain Descriptors / Indicators: Aching;Operative site guarding Pain Intervention(s): Limited activity within patient's tolerance;Monitored during session;Premedicated before session;Repositioned;Ice applied    Home Living Family/patient expects to be discharged to:: Private residence Living Arrangements: Alone Available Help at Discharge: Family;Friend(s);Available 24 hours/day Type of Home: House Home Access: Stairs to enter Entrance Stairs-Rails: Can reach both Entrance Stairs-Number of Steps: 2-3 Home Layout: One level Home Equipment: Cane - single point      Prior Function Level of Independence: Independent         Comments: Patient reports she still drives and goes out to meet with her friends.      Hand Dominance        Extremity/Trunk Assessment  Lower Extremity Assessment: RLE deficits/detail RLE Deficits / Details: Able to complete 10 SLRs, LAQs through available ROM        Communication   Communication: No difficulties  Cognition Arousal/Alertness: Awake/alert Behavior During Therapy: WFL for tasks assessed/performed Overall Cognitive Status: Within Functional Limits for  tasks assessed                      General Comments General comments (skin integrity, edema, etc.): Bandage over RLE, drains/tubes in place.     Exercises Total Joint Exercises Ankle Circles/Pumps: AROM;Both;10 reps Gluteal Sets: AROM;Both;10 reps Heel Slides: AAROM;Right;AROM;Left;10 reps Hip ABduction/ADduction: AROM;Both;10 reps Straight Leg Raises: AROM;Both;10 reps Long Arc Quad: AROM;AAROM;Right;Left;10 reps Goniometric ROM: 8-55      Assessment/Plan    PT Assessment Patient needs continued PT services  PT Diagnosis Difficulty walking;Generalized weakness   PT Problem List Decreased strength;Decreased range of motion;Decreased mobility;Decreased safety awareness;Pain;Decreased skin integrity;Decreased activity tolerance;Decreased balance;Decreased knowledge of use of DME  PT Treatment Interventions DME instruction;Therapeutic activities;Therapeutic exercise;Gait training;Stair training;Balance training;Neuromuscular re-education;Patient/family education   PT Goals (Current goals can be found in the Care Plan section) Acute Rehab PT Goals Patient Stated Goal: To increase her mobility to see her friends again  PT Goal Formulation: With patient Time For Goal Achievement: 07/02/16 Potential to Achieve Goals: Good    Frequency BID   Barriers to discharge        Co-evaluation               End of Session Equipment Utilized During Treatment: Gait belt Activity Tolerance: Patient tolerated treatment well (Patient had vomiting episode at conclusion of session, has had N/V all day.) Patient left: in chair;with chair alarm set;with call bell/phone within reach Nurse Communication: Mobility status (About vomiting)         Time: [id number] PT Time Calculation (min) (ACUTE ONLY): 29 min   Charges:   PT Evaluation $PT Eval Moderate Complexity: 1 Procedure PT Treatments $Therapeutic Exercise: 8-22 mins   PT G Codes:       Kerman Passey, PT, DPT     06/18/2016, 6:17 PM

## 2016-06-18 NOTE — Brief Op Note (Signed)
06/18/2016  10:55 AM  PATIENT:  Heather Colon  82 y.o. female  PRE-OPERATIVE DIAGNOSIS:  OSTEOARTHRITIS of the right knee  POST-OPERATIVE DIAGNOSIS:  Same  PROCEDURE:  Procedure(s): COMPUTER ASSISTED TOTAL KNEE ARTHROPLASTY (Right)  SURGEON:  Surgeon(s) and Role:    * Dereck Leep, MD - Primary  ASSISTANTS: Rachelle Hora, PA-C   ANESTHESIA:   spinal  EBL:  Total I/O In: 900 [I.V.:900] Out: 400 [Urine:350; Blood:50]  BLOOD ADMINISTERED:none  DRAINS: 2 medium drains to a reinfusion system   LOCAL MEDICATIONS USED:  MARCAINE    and OTHER Exparel  SPECIMEN:  No Specimen  DISPOSITION OF SPECIMEN:  N/A  COUNTS:  YES  TOURNIQUET:   90 minutes  DICTATION: .Dragon Dictation  PLAN OF CARE: Admit to inpatient   PATIENT DISPOSITION:  PACU - hemodynamically stable.   Delay start of Pharmacological VTE agent (>24hrs) due to surgical blood loss or risk of bleeding: yes

## 2016-06-18 NOTE — H&P (Signed)
The patient has been re-examined, and the chart reviewed, and there have been no interval changes to the documented history and physical.    The risks, benefits, and alternatives have been discussed at length. The patient expressed understanding of the risks benefits and agreed with plans for surgical intervention.  James P. Hooten, Jr. M.D.    

## 2016-06-18 NOTE — Transfer of Care (Signed)
Immediate Anesthesia Transfer of Care Note  Patient: Heather Colon  Procedure(s) Performed: Procedure(s): COMPUTER ASSISTED TOTAL KNEE ARTHROPLASTY (Right)  Patient Location: PACU  Anesthesia Type:Spinal  Level of Consciousness: awake  Airway & Oxygen Therapy: Patient Spontanous Breathing and Patient connected to nasal cannula oxygen  Post-op Assessment: Report given to RN and Post -op Vital signs reviewed and stable  Post vital signs: Reviewed and stable  Last Vitals:  Vitals:   06/18/16 0630  BP: (!) 148/45  Pulse: 78  Resp: 14  Temp: 36.4 C    Last Pain:  Vitals:   06/18/16 0630  TempSrc: Oral         Complications: No apparent anesthesia complications

## 2016-06-18 NOTE — Op Note (Signed)
OPERATIVE NOTE  DATE OF SURGERY:  06/18/2016  PATIENT NAME:  Heather Colon   DOB: 1933/08/17  MRN: [id number]  PRE-OPERATIVE DIAGNOSIS: Degenerative arthrosis of the right knee, primary  POST-OPERATIVE DIAGNOSIS:  Same  PROCEDURE:  Right total knee arthroplasty using computer-assisted navigation  SURGEON:  Marciano Sequin. M.D.  ASSISTANT:  Rachelle Hora, PA-C (present and scrubbed throughout the case, critical for assistance with exposure, retraction, instrumentation, and closure)  ANESTHESIA: spinal  ESTIMATED BLOOD LOSS: 50 mL  FLUIDS REPLACED: 900 mL of crystalloid  TOURNIQUET TIME: 90 minutes  DRAINS: 2 medium drains to a reinfusion system  SOFT TISSUE RELEASES: Anterior cruciate ligament, posterior cruciate ligament, deep and superficial medial collateral ligament, patellofemoral ligament  IMPLANTS UTILIZED: DePuy Attune size 6N (narrow) posterior stabilized femoral component (cemented), size 4 rotating platform tibial component (cemented), 35 mm medialized dome patella (cemented), and a 5 mm stabilized rotating platform polyethylene insert.  INDICATIONS FOR SURGERY: Heather Colon is a 82 y.o. year old female with a long history of progressive knee pain. X-rays demonstrated severe degenerative changes in tricompartmental fashion. The patient had not seen any significant improvement despite conservative nonsurgical intervention. After discussion of the risks and benefits of surgical intervention, the patient expressed understanding of the risks benefits and agree with plans for total knee arthroplasty.   The risks, benefits, and alternatives were discussed at length including but not limited to the risks of infection, bleeding, nerve injury, stiffness, blood clots, the need for revision surgery, cardiopulmonary complications, among others, and they were willing to proceed.  PROCEDURE IN DETAIL: The patient was brought into the operating room and, after adequate spinal  anesthesia was achieved, a tourniquet was placed on the patient's upper thigh. The patient's knee and leg were cleaned and prepped with alcohol and DuraPrep and draped in the usual sterile fashion. A "timeout" was performed as per usual protocol. The lower extremity was exsanguinated using an Esmarch, and the tourniquet was inflated to 300 mmHg. An anterior longitudinal incision was made followed by a standard mid vastus approach. The deep fibers of the medial collateral ligament were elevated in a subperiosteal fashion off of the medial flare of the tibia so as to maintain a continuous soft tissue sleeve. The patella was subluxed laterally and the patellofemoral ligament was incised. Inspection of the knee demonstrated severe degenerative changes with full-thickness loss of articular cartilage. Osteophytes were debrided using a rongeur. Anterior and posterior cruciate ligaments were excised. Two 4.0 mm Schanz pins were inserted in the femur and into the tibia for attachment of the array of trackers used for computer-assisted navigation. Hip center was identified using a circumduction technique. Distal landmarks were mapped using the computer. The distal femur and proximal tibia were mapped using the computer. The distal femoral cutting guide was positioned using computer-assisted navigation so as to achieve a 5 distal valgus cut. The femur was sized and it was felt that a size 6N (narrow) femoral component was appropriate. A size 6 femoral cutting guide was positioned and the anterior cut was performed and verified using the computer. This was followed by completion of the posterior and chamfer cuts. Femoral cutting guide for the central box was then positioned in the center box cut was performed.  Attention was then directed to the proximal tibia. Medial and lateral menisci were excised. The extramedullary tibial cutting guide was positioned using computer-assisted navigation so as to achieve a 0 varus-valgus  alignment and 3 posterior slope. The cut was performed and verified using  the computer. The proximal tibia was sized and it was felt that a size 4 tibial tray was appropriate. Tibial and femoral trials were inserted followed by insertion of a 5 mm polyethylene insert. The knee was felt to be tight medially. A Cobb elevator was used to elevate the superficial fibers of the medial collateral ligament. This allowed for excellent mediolateral soft tissue balancing both in flexion and in full extension. Finally, the patella was cut and prepared so as to accommodate a 35 mm medialized dome patella. A patella trial was placed and the knee was placed through a range of motion with excellent patellar tracking appreciated. The femoral trial was removed after debridement of posterior osteophytes. The central post-hole for the tibial component was reamed followed by insertion of a keel punch. Tibial trials were then removed. Cut surfaces of bone were irrigated with copious amounts of normal saline with antibiotic solution using pulsatile lavage and then suctioned dry. Polymethylmethacrylate cement was prepared in the usual fashion using a vacuum mixer. Cement was applied to the cut surface of the proximal tibia as well as along the undersurface of a size 4 rotating platform tibial component. Tibial component was positioned and impacted into place. Excess cement was removed using [employer] fast streamer. Cement was then applied to the cut surfaces of the femur as well as along the posterior flanges of the size 6N (narrow) femoral component. The femoral component was positioned and impacted into place. Excess cement was removed using [employer] fast streamer. A 5 mm polyethylene trial was inserted and the knee was brought into full extension with steady axial compression applied. Finally, cement was applied to the backside of a 35 mm medialized dome patella and the patellar component was positioned and patellar clamp applied. Excess cement was  removed using [employer] fast streamer. After adequate curing of the cement, the tourniquet was deflated after a total tourniquet time of 90 minutes. Hemostasis was achieved using electrocautery. The knee was irrigated with copious amounts of normal saline with antibiotic solution using pulsatile lavage and then suctioned dry. 20 mL of 1.3% Exparel and 60 mL of 0.25% Marcaine in 40 mL of normal saline was injected along the posterior capsule, medial and lateral gutters, and along the arthrotomy site. A 5 mm stabilized rotating platform polyethylene insert was inserted and the knee was placed through a range of motion with excellent mediolateral soft tissue balancing appreciated and excellent patellar tracking noted. 2 medium drains were placed in the wound bed and brought out through separate stab incisions to be attached to a reinfusion system. The medial parapatellar portion of the incision was reapproximated using interrupted sutures of #1 Vicryl. Subcutaneous tissue was approximated in layers using first #0 Vicryl followed #2-0 Vicryl. The skin was approximated with skin staples. A sterile dressing was applied.  The patient tolerated the procedure well and was transported to the recovery room in stable condition.    James P. Holley Bouche., M.D.

## 2016-06-18 NOTE — NC FL2 (Signed)
[place] LEVEL OF CARE SCREENING TOOL     IDENTIFICATION  Patient Name: Heather Colon Birthdate: 08-Nov-1932 Sex: female Admission Date (Current Location): 06/18/2016  [place] and [place] Number:  Engineering geologist and Address:  [facility], [address], [place], [place] [address]      Provider Number: [id number]  Attending Physician Name and Address:  Dereck Leep, MD  Relative Name and Phone Number:       Current Level of Care: Hospital Recommended Level of Care: [place] Prior Approval Number:    Date Approved/Denied:   PASRR Number:  ([id number])  Discharge Plan: SNF    Current Diagnoses: Patient Active Problem List   Diagnosis Date Noted  . S/P total knee arthroplasty 06/18/2016   HTN (hypertension), benign 03/27/2014   Hypothyroid, unspecified 03/27/2014   Osteoporosis 03/27/2014   Hyperlipidemia, unspecified 03/27/2014   Carotid stenosis 03/27/2014   CKD (chronic kidney disease) stage 3, GFR 30-59 ml/min 04/08/2014   Menopausal syndrome    Allergic rhinitis    Osteoarthritis       Orientation RESPIRATION BLADDER Height & Weight     Self, Time, Situation, Place  O2 (2 Liters Oxygen ) Continent Weight: 100 lb (45.4 kg) Height:  5\' 1"  (154.9 cm)  BEHAVIORAL SYMPTOMS/MOOD NEUROLOGICAL BOWEL NUTRITION STATUS   (none)  (none) Continent Diet (Diet: Clear Liquid )  AMBULATORY STATUS COMMUNICATION OF NEEDS Skin   Extensive Assist Verbally Surgical wounds (Incision: Right Knee )                       Personal Care Assistance Level of Assistance  Bathing, Feeding, Dressing Bathing Assistance: Limited assistance Feeding assistance: Independent Dressing Assistance: Limited assistance     Functional Limitations Info  Sight, Hearing, Speech Sight Info: Adequate Hearing Info: Adequate Speech Info: Adequate    SPECIAL CARE FACTORS FREQUENCY  PT (By licensed PT), OT (By licensed OT)      PT Frequency:  (5) OT Frequency:  (5)            Contractures      Additional Factors Info  Code Status, Allergies, Isolation Precautions Code Status Info:  (Full Code. ) Allergies Info:  (Actonel Risedronate Sodium, Codeine)     Isolation Precautions Info:  (MRSA Nasal Swab. )     Current Medications (06/18/2016):  This is the current hospital active medication list Current Facility-Administered Medications  Medication Dose Route Frequency Provider Last Rate Last Dose  . 0.9 %  sodium chloride infusion   Intravenous Continuous Dereck Leep, MD 100 mL/hr at 06/18/16 1251    . acetaminophen (OFIRMEV) IV 1,000 mg  1,000 mg Intravenous Q6H Dereck Leep, MD   1,000 mg at 06/18/16 1323  . acetaminophen (TYLENOL) tablet 650 mg  650 mg Oral Q6H PRN Dereck Leep, MD       Or  . acetaminophen (TYLENOL) suppository 650 mg  650 mg Rectal Q6H PRN Dereck Leep, MD      . alum & mag hydroxide-simeth (MAALOX/MYLANTA) 200-200-20 MG/5ML suspension 30 mL  30 mL Oral Q4H PRN Dereck Leep, MD      . bisacodyl (DULCOLAX) suppository 10 mg  10 mg Rectal Daily PRN Dereck Leep, MD      . ceFAZolin (ANCEF) 2-4 GM/100ML-% IVPB           . ceFAZolin (ANCEF) IVPB 2g/100 mL premix  2 g Intravenous Q6H James P  Hooten, MD   2 g at 06/18/16 1355  . [START ON 06/19/2016] cholecalciferol (VITAMIN D) tablet 2,000 Units  2,000 Units Oral BH-q7a Dereck Leep, MD      . diphenhydrAMINE (BENADRYL) 12.5 MG/5ML elixir 12.5-25 mg  12.5-25 mg Oral Q4H PRN Dereck Leep, MD      . Derrill Memo ON 06/19/2016] enoxaparin (LOVENOX) injection 30 mg  30 mg Subcutaneous Q12H Dereck Leep, MD      . fentaNYL (SUBLIMAZE) 100 MCG/2ML injection           . ferrous sulfate tablet 325 mg  325 mg Oral BID WC Dereck Leep, MD      . fluocinonide ointment (LIDEX) [id number] % 1 application  1 application Topical Daily PRN Dereck Leep, MD      . losartan (COZAAR) tablet 50 mg  50 mg Oral Daily Dereck Leep, MD       And  .  hydrochlorothiazide (MICROZIDE) capsule 12.5 mg  12.5 mg Oral Daily Dereck Leep, MD      . latanoprost (XALATAN) 0.005 % ophthalmic solution 1 drop  1 drop Both Eyes QHS Dereck Leep, MD      . Derrill Memo ON 06/25/2016] liothyronine (CYTOMEL) tablet 12.5 mcg  12.5 mcg Oral Q Mon Dereck Leep, MD      . Derrill Memo ON 06/22/2016] liothyronine (CYTOMEL) tablet 12.5 mcg  12.5 mcg Oral Q Fri Dereck Leep, MD      . Derrill Memo ON 06/19/2016] liothyronine (CYTOMEL) tablet 25 mcg  25 mcg Oral Daily Dereck Leep, MD      . magnesium hydroxide (MILK OF MAGNESIA) suspension 30 mL  30 mL Oral Daily PRN Dereck Leep, MD      . menthol-cetylpyridinium (CEPACOL) lozenge 3 mg  1 lozenge Oral PRN Dereck Leep, MD       Or  . phenol (CHLORASEPTIC) mouth spray 1 spray  1 spray Mouth/Throat PRN Dereck Leep, MD      . metoCLOPramide (REGLAN) tablet 10 mg  10 mg Oral TID AC & HS Dereck Leep, MD      . morphine 2 MG/ML injection 2 mg  2 mg Intravenous Q2H PRN Dereck Leep, MD   2 mg at 06/18/16 1323  . [START ON 06/19/2016] multivitamin with minerals tablet 1 tablet  1 tablet Oral BH-q7a Dereck Leep, MD      . ondansetron [facility]) 4 MG/2ML injection           . ondansetron (ZOFRAN) tablet 4 mg  4 mg Oral Q6H PRN Dereck Leep, MD       Or  . ondansetron (ZOFRAN) injection 4 mg  4 mg Intravenous Q6H PRN Dereck Leep, MD      . oxyCODONE (Oxy IR/ROXICODONE) immediate release tablet 5-10 mg  5-10 mg Oral Q4H PRN Dereck Leep, MD      . pantoprazole (PROTONIX) EC tablet 40 mg  40 mg Oral BID Dereck Leep, MD      . Derrill Memo ON 06/19/2016] pneumococcal 23 valent vaccine (PNU-IMMUNE) injection 0.5 mL  0.5 mL Intramuscular Tomorrow-1000 Dereck Leep, MD      . polyvinyl alcohol (LIQUIFILM TEARS) 1.4 % ophthalmic solution 1 drop  1 drop Both Eyes Daily PRN Dereck Leep, MD      . senna-docusate (Senokot-S) tablet 1 tablet  1 tablet Oral BID Dereck Leep, MD      . sodium  chloride flush 0.9 % injection            . sodium phosphate (FLEET) 7-19 GM/118ML enema 1 enema  1 enema Rectal Once PRN Dereck Leep, MD      . traMADol Veatrice Bourbon) tablet 50-100 mg  50-100 mg Oral Q4H PRN Dereck Leep, MD      . Verapamil HCl CR CP24 200 mg  1 capsule Oral QHS Dereck Leep, MD         Discharge Medications: Please see discharge summary for a list of discharge medications.  Relevant Imaging Results:  Relevant Lab Results:   Additional Information  (SSN: [id number])  Sample, Veronia Beets, LCSW

## 2016-06-18 NOTE — Anesthesia Preprocedure Evaluation (Signed)
Anesthesia Evaluation  Patient identified by MRN, date of birth, ID band Patient awake    Reviewed: Allergy & Precautions, H&P , NPO status , Patient's Chart, lab work & pertinent test results  History of Anesthesia Complications Negative for: history of anesthetic complications  Airway Mallampati: III  TM Distance: <3 FB Neck ROM: limited    Dental  (+) Poor Dentition   Pulmonary neg pulmonary ROS, neg shortness of breath,    Pulmonary exam normal breath sounds clear to auscultation       Cardiovascular Exercise Tolerance: Good hypertension, (-) angina+ CAD  (-) Past MI Normal cardiovascular exam Rhythm:regular Rate:Normal     Neuro/Psych negative neurological ROS  negative psych ROS   GI/Hepatic negative GI ROS, Neg liver ROS,   Endo/Other  Hypothyroidism   Renal/GU ESRFRenal disease  negative genitourinary   Musculoskeletal  (+) Arthritis ,   Abdominal   Peds  Hematology negative hematology ROS (+)   Anesthesia Other Findings Past Medical History: No date: Arthritis No date: Chronic kidney disease     Comment: phase 3 No date: Coronary artery disease No date: Glaucoma     Comment: both eyes No date: Hypertension No date: Hypothyroidism No date: Osteoporosis  Past Surgical History: No date: ABDOMINAL HYSTERECTOMY 2010: CATARACT EXTRACTION W/ INTRAOCULAR LENS IMPLANT Left 2010: CATARACT EXTRACTION W/ INTRAOCULAR LENS IMPLANT Right 2017: MOUTH SURGERY 1970's: THYROID SURGERY Bilateral     Reproductive/Obstetrics negative OB ROS                             Anesthesia Physical Anesthesia Plan  ASA: III  Anesthesia Plan: Spinal   Post-op Pain Management:    Induction:   Airway Management Planned:   Additional Equipment:   Intra-op Plan:   Post-operative Plan:   Informed Consent: I have reviewed the patients History and Physical, chart, labs and discussed the  procedure including the risks, benefits and alternatives for the proposed anesthesia with the patient or authorized representative who has indicated his/her understanding and acceptance.   Dental Advisory Given  Plan Discussed with: Anesthesiologist, CRNA and Surgeon  Anesthesia Plan Comments:         Anesthesia Quick Evaluation

## 2016-06-18 NOTE — Anesthesia Procedure Notes (Signed)
Spinal  Patient location during procedure: OR Start time: 06/18/2016 7:24 AM End time: 06/18/2016 7:28 AM Staffing Anesthesiologist: Katy Fitch K Performed: anesthesiologist  Preanesthetic Checklist Completed: patient identified, site marked, surgical consent, pre-op evaluation, timeout performed, IV checked, risks and benefits discussed and monitors and equipment checked Spinal Block Patient position: sitting Prep: ChloraPrep Patient monitoring: heart rate, continuous pulse ox, blood pressure and cardiac monitor Approach: midline Location: L4-5 Injection technique: single-shot Needle Needle type: Whitacre and Introducer  Needle gauge: 24 G Needle length: 9 cm Assessment Sensory level: T10 Additional Notes Negative paresthesia. Negative blood return. Positive free-flowing CSF. Expiration date of kit checked and confirmed. Patient tolerated procedure well, without complications.

## 2016-06-19 ENCOUNTER — Encounter: Payer: Self-pay | Admitting: Orthopedic Surgery

## 2016-06-19 LAB — BASIC METABOLIC PANEL
Anion gap: 7 (ref 5–15)
BUN: 22 mg/dL — ABNORMAL HIGH (ref 6–20)
CO2: 25 mmol/L (ref 22–32)
Calcium: 8 mg/dL — ABNORMAL LOW (ref 8.9–10.3)
Chloride: 102 mmol/L (ref 101–111)
Creatinine, Ser: 1.09 mg/dL — ABNORMAL HIGH (ref 0.44–1.00)
GFR calc Af Amer: 54 mL/min — ABNORMAL LOW (ref >60)
GFR calc non Af Amer: 46 mL/min — ABNORMAL LOW (ref >60)
Glucose, Bld: 98 mg/dL (ref 65–99)
Potassium: 3.6 mmol/L (ref 3.5–5.1)
Sodium: 134 mmol/L — ABNORMAL LOW (ref 135–145)

## 2016-06-19 LAB — CBC
HCT: 28.9 % — ABNORMAL LOW (ref 35.0–47.0)
Hemoglobin: 10.2 g/dL — ABNORMAL LOW (ref 12.0–16.0)
MCH: 31.5 pg (ref 26.0–34.0)
MCHC: 35.5 g/dL (ref 32.0–36.0)
MCV: 88.9 fL (ref 80.0–100.0)
Platelets: 129 10*3/uL — ABNORMAL LOW (ref 150–440)
RBC: 3.25 MIL/uL — ABNORMAL LOW (ref 3.80–5.20)
RDW: 13.2 % (ref 11.5–14.5)
WBC: 9 10*3/uL (ref 3.6–11.0)

## 2016-06-19 MED ORDER — HALOPERIDOL LACTATE 5 MG/ML IJ SOLN: 1.0000 mg | INTRAMUSCULAR | Status: DC | PRN

## 2016-06-19 NOTE — Progress Notes (Signed)
ORTHOPAEDICS PROGRESS NOTE  PATIENT NAME: Heather Colon DOB: 08-07-1933  MRN: [id number]  POD # 1: Status post right total knee arthroplasty  Subjective: The patient states that the pain is well-controlled. She did have some emesis yesterday but denies any nausea this morning. Notes from physical therapy were reviewed. The patient tolerated yesterday's session well.  Objective: Vital signs in last 24 hours: Temp:  [97.2 F (36.2 C)-98.8 F (37.1 C)] 97.7 F (36.5 C) (08/29 0438) Pulse Rate:  [53-65] 57 (08/29 0438) Resp:  [12-18] 16 (08/29 0438) BP: (116-161)/(41-66) 116/46 (08/29 0438) SpO2:  [97 %-100 %] 99 % (08/29 0438) Weight:  [45.4 kg (100 lb)] 45.4 kg (100 lb) (08/28 1333)  Intake/Output from previous day: 08/28 0701 - 08/29 0700 In: 4863.3 [P.O.:760; I.V.:2593.3; IV Piggyback:800] Out: 1510 [Urine:1100; Emesis/NG output:100; Drains:260; Blood:50]   Recent Labs  06/19/16 0541  WBC 9.0  HGB 10.2*  HCT 28.9*  PLT 129*  K 3.6  CL 102  CO2 25  BUN 22*  CREATININE 1.09*  GLUCOSE 98  CALCIUM 8.0*    EXAM General: Well-developed well-nourished female seen in no acute distress. Lungs: clear to auscultation Cardiac: normal rate, regular rhythm, normal S1, S2, no murmurs, rubs, clicks or gallops Right lower extremity: Surgical dressing is dry and intact. Bone Foam is in place with the knee fully extended. The patient is able to perform independent straight leg raise. Homans test is negative. Neurologic: Awake, alert, and oriented. Sensory and motor function are grossly intact.  Assessment: Right total knee arthroplasty Acute blood loss anemia, hemodynamically stable  Active Problems:   S/P total knee arthroplasty  Secondary diagnoses: Hypertension Hypothyroidism Osteoporosis Hyperlipidemia Carotid stenosis Chronic kidney disease, stage III  Plan: Today's goal were reviewed with the patient.  Continue physical therapy and occupational therapy as per  total knee arthroplasty rehabilitation protocol. Advance diet as tolerated. Labs in the morning. DVT Prophylaxis - Lovenox, Foot Pumps and TED hose FL 2 signed.  Heather Colon P. Holley Bouche M.D.

## 2016-06-19 NOTE — Progress Notes (Signed)
Physical Therapy Treatment Patient Details Name: Heather Colon MRN: [place]:[id number] DOB: 08/07/1933 Today's Date: 06/19/2016    History of Present Illness Patient is POD#0 on 8/28 for R TKR     PT Comments    Pt presented sitting at EOB having just completed OT eval.  Discussed with pt benefits of use of RW at initial stages of therapy.  LSW entered room and discussed equipment needs and d/c planning with pt.  PTA explained to pt decision for HHPT vs SNF.  Pt ambulated 176ft with step to progressing to step through pattern with cues to increase knee flexion where possible. Continued edu on safet sit to/from stand transfers and practiced x4 in/out chair with good carryover. Pt returned to room and settled in chair with heel prop in place.  PTA provided edu to pt regarding use of heel prop and to perform quad sets as part of HEP during day.  Will perform HEP during pm session.    Follow Up Recommendations        Equipment Recommendations       Recommendations for Other Services       Precautions / Restrictions Restrictions Weight Bearing Restrictions: Yes RLE Weight Bearing: Weight bearing as tolerated    Mobility  Bed Mobility Overal bed mobility: Needs Assistance Bed Mobility: Supine to Sit     Supine to sit: Supervision     General bed mobility comments: NT pt sitting at EOB   Transfers Overall transfer level: Needs assistance Equipment used: Rolling walker (2 wheeled) Transfers: Sit to/from Stand Sit to Stand:  (With increased time, and cues for hand placement.)         General transfer comment: Min cues for sequencing and safety   Ambulation/Gait Ambulation/Gait assistance: Min guard Ambulation Distance (Feet): 100 Feet Assistive device: Rolling walker (2 wheeled) Gait Pattern/deviations: Step-to pattern;Step-through pattern;Decreased step length - right;Decreased stance time - right;Decreased stride length     General Gait Details: Pt initially started step to  progressed to step through pattern, Pt able to provide gentle knee flexion when ambulating.  Noted increased UE use with gait which required pt to take x1 brief rest 2/2 fatigue.    Stairs            Wheelchair Mobility    Modified Rankin (Stroke Patients Only)       Balance Overall balance assessment: Needs assistance Sitting-balance support: No upper extremity supported Sitting balance-Leahy Scale: Good     Standing balance support: Bilateral upper extremity supported Standing balance-Leahy Scale: Good                      Cognition Arousal/Alertness: Awake/alert Behavior During Therapy: WFL for tasks assessed/performed Overall Cognitive Status: Within Functional Limits for tasks assessed                      Exercises      General Comments        Pertinent Vitals/Pain Pain Assessment: 0-10 Pain Score: 2  Faces Pain Scale: Hurts a little bit Pain Location: R knee Pain Descriptors / Indicators: Aching;Operative site guarding Pain Intervention(s): Limited activity within patient's tolerance    Home Living Family/patient expects to be discharged to:: Private residence Living Arrangements: Alone Available Help at Discharge: Family;Friend(s);Available 24 hours/day Type of Home: House Home Access: Stairs to enter Entrance Stairs-Rails: Can reach both Home Layout: One level Home Equipment: Cane - single point      Prior Function Level of  Independence: Independent          PT Goals (current goals can now be found in the care plan section) Acute Rehab PT Goals Patient Stated Goal: To return home Progress towards PT goals: Progressing toward goals    Frequency       PT Plan      Co-evaluation             End of Session Equipment Utilized During Treatment: Gait belt Activity Tolerance: Patient tolerated treatment well Patient left: in chair;with call bell/phone within reach;with chair alarm set     Time: 1000-1035 PT Time  Calculation (min) (ACUTE ONLY): 35 min  Charges:  $Gait Training: 8-22 mins $Therapeutic Activity: 8-22 mins                    G Codes:      Lavaughn Bisig 2016/07/02, 12:45 PM Adreona Brand, PTA

## 2016-06-19 NOTE — Progress Notes (Signed)
Physical Therapy Treatment Patient Details Name: Heather Colon MRN: [id number] DOB: 1933/06/15 Today's Date: 06/19/2016    History of Present Illness Patient is POD#1 on 8/28 for R TKR     PT Comments    Pt presented in chair, performed sit to stand with CGA with good carryover of safety from am session. Ambulated 23f x 1 with x1 seated rest 2/2 instruction for stair training. Ascend/decend x4 step step to pattern with use of R rail.  Mod cues for sequencing with overall good safety. Pt initiated in HEP per packet with handout given.  Performed x10 reps of all activities except knee flexion in sitting. Left pt in room with all current needs met.   Follow Up Recommendations        Equipment Recommendations       Recommendations for Other Services       Precautions / Restrictions Restrictions Weight Bearing Restrictions: Yes RLE Weight Bearing: Weight bearing as tolerated    Mobility  Bed Mobility Overal bed mobility: Needs Assistance Bed Mobility: Supine to Sit     Supine to sit: Supervision        Transfers Overall transfer level: Needs assistance Equipment used: Rolling walker (2 wheeled) Transfers: Sit to/from Stand Sit to Stand: Min guard         General transfer comment: Demonstrated good carryover from am session  Ambulation/Gait Ambulation/Gait assistance: Min guard Ambulation Distance (Feet): 200 Feet Assistive device: Rolling walker (2 wheeled) Gait Pattern/deviations: Step-to pattern;Step-through pattern     General Gait Details: Pt initially guarded with step to pattern improved to step through with progressed gait    Stairs Stairs: Yes Stairs assistance: Min guard Stair Management: One rail Right;Step to pattern;Sideways Number of Stairs: 4 General stair comments: mod cues for sequencing, demonstrated good technique   Wheelchair Mobility    Modified Rankin (Stroke Patients Only)       Balance Overall balance assessment: Needs  assistance Sitting-balance support: No upper extremity supported Sitting balance-Leahy Scale: Good     Standing balance support: Bilateral upper extremity supported Standing balance-Leahy Scale: Good                      Cognition Arousal/Alertness: Awake/alert Behavior During Therapy: WFL for tasks assessed/performed Overall Cognitive Status: Within Functional Limits for tasks assessed                      Exercises Total Joint Exercises Ankle Circles/Pumps: AROM;Both;10 reps;Seated Heel Slides: AROM;Right;10 reps;Seated Hip ABduction/ADduction: AROM;Right;10 reps;Seated Straight Leg Raises: Strengthening;Right;10 reps;Seated Long Arc Quad: AROM;Right;10 reps;Seated    General Comments        Pertinent Vitals/Pain Pain Assessment: 0-10 Pain Score: 3  Faces Pain Scale: Hurts a little bit Pain Location: R knee Pain Descriptors / Indicators: Aching;Operative site guarding Pain Intervention(s): Limited activity within patient's tolerance    Home Living Family/patient expects to be discharged to:: Private residence Living Arrangements: Alone Available Help at Discharge: Family;Friend(s);Available 24 hours/day Type of Home: House Home Access: Stairs to enter Entrance Stairs-Rails: Can reach both Home Layout: One level Home Equipment: Cane - single point      Prior Function Level of Independence: Independent          PT Goals (current goals can now be found in the care plan section) Acute Rehab PT Goals Patient Stated Goal: To return home Progress towards PT goals: Progressing toward goals    Frequency       PT  Plan      Co-evaluation             End of Session   Activity Tolerance: Patient tolerated treatment well Patient left: in chair;with call bell/phone within reach;with chair alarm set;with family/visitor present     Time: 5053-9767 PT Time Calculation (min) (ACUTE ONLY): 43 min  Charges:  $Gait Training: 8-22  mins $Therapeutic Exercise: 8-22 mins $Therapeutic Activity: 8-22 mins                    G Codes:      Heather Colon 06-Jul-2016, 2:26 PM  Heather Colon, PTA

## 2016-06-19 NOTE — Anesthesia Post-op Follow-up Note (Signed)
  Anesthesia Pain Follow-up Note  Patient: Heather Colon  Day #: 1  Date of Follow-up: 06/19/2016 Time: 7:36 AM  Last Vitals:  Vitals:   06/19/16 0438 06/19/16 0722  BP: (!) 116/46 (!) 130/48  Pulse: (!) 57 (!) 50  Resp: 16 18  Temp: 36.5 C 36.4 C    Level of Consciousness: alert  Pain: none   Side Effects:None  Catheter Site Exam:clean, dry  Anti-Coag Meds    Start     Dose/Rate Route Frequency Ordered Stop   06/19/16 0800  enoxaparin (LOVENOX) injection 30 mg     30 mg Subcutaneous Every 12 hours 06/18/16 1230         Plan: D/C from anesthesia care  [facility]

## 2016-06-19 NOTE — Evaluation (Addendum)
Occupational Therapy Evaluation Patient Details Name: Heather Colon MRN: [id number] DOB: 10-28-1932 Today's Date: 06/19/2016    History of Present Illness Patient is POD#0 on 8/28 for R TKR    Clinical Impression   Pt. Is an 82y.o. female who was admitted for a rightTKR. Pt presents with limited ROM, Pain, weakness, and impaired functional mobility which hinder her ability to complete ADL and IADL tasks. Pt. could benefit from skilled OT services to review A/E use for LE ADLs, to review necessary home modifications, and to improve functional mobility for ADL/IADLs in order to work towards regaining Independence with ADL/IADLs.     Follow Up Recommendations       Equipment Recommendations       Recommendations for Other Services       Precautions / Restrictions Restrictions Weight Bearing Restrictions: Yes RLE Weight Bearing: Weight bearing as tolerated      Mobility Bed Mobility Overal bed mobility: Needs Assistance Bed Mobility: Supine to Sit     Supine to sit: Supervision                           Balance                                            ADL Overall ADL's : Independent;Needs assistance/impaired Eating/Feeding: Set up   Grooming: Set up               Lower Body Dressing: Min guard               Functional mobility during ADLs: Min guard General ADL Comments: Pt. education was provided about A/E use for LE ADLs.     Vision     Perception     Praxis      Pertinent Vitals/Pain Pain Assessment: 0-10 Pain Score: 2  Faces Pain Scale: Hurts a little bit     Hand Dominance Right   Extremity/Trunk Assessment Upper Extremity Assessment Upper Extremity Assessment: Overall WFL for tasks assessed           Communication Communication Communication: No difficulties   Cognition Arousal/Alertness: Awake/alert Behavior During Therapy: WFL for tasks assessed/performed Overall Cognitive Status: Within  Functional Limits for tasks assessed                     General Comments       Exercises       Shoulder Instructions      Home Living Family/patient expects to be discharged to:: Private residence Living Arrangements: Alone Available Help at Discharge: Family;Friend(s);Available 24 hours/day Type of Home: House Home Access: Stairs to enter [employer] of Steps: 2-3 Entrance Stairs-Rails: Can reach both Home Layout: One level     Bathroom Shower/Tub: Tub/shower unit;Curtain Shower/tub characteristics: Curtain       Home Equipment: Cane - single point          Prior Functioning/Environment Level of Independence: Independent             OT Diagnosis: Generalized weakness   OT Problem List: Decreased strength;Decreased activity tolerance;Pain   OT Treatment/Interventions: Self-care/ADL training;Therapeutic exercise;Patient/family education;Therapeutic activities    OT Goals(Current goals can be found in the care plan section) Acute Rehab OT Goals Patient Stated Goal: To return home OT Goal Formulation: With patient Potential to Achieve Goals: Good ADL Goals  Pt Will Perform Lower Body Dressing: with modified independence Pt Will Transfer to Toilet: with modified independence Additional ADL Goal #1: Pt. will demonstrate posterior hip precautions 100% of the time duiring ADLs.  OT Frequency: Min 1X/week   Barriers to D/C:            Co-evaluation              End of Session Equipment Utilized During Treatment: Gait belt  Activity Tolerance: Patient tolerated treatment well Patient left: in bed;with nursing/sitter in room   Time: 0930-1000 OT Time Calculation (min): 30 min Charges:  OT General Charges $OT Visit: 1 Procedure OT Evaluation $OT Eval Moderate Complexity: 1 Procedure G-Codes:    Harrel Carina, MS, OTR/L 06/19/2016, 11:32 AM

## 2016-06-19 NOTE — Progress Notes (Signed)
Clinical Social Worker (CSW) received SNF consult. PT is recommending home health. RN Case Manager is aware of above. Please reconsult if future social work needs arise. CSW signing off.   Richardson Dubree, LCSW [phone] 

## 2016-06-19 NOTE — Progress Notes (Signed)
Pts. Foley d/c'd at 29 with 350cc urine output

## 2016-06-19 NOTE — Progress Notes (Signed)
Patient alert to self. Verbally aggressive and combative.  Patient unable to reorient. Nursing interventions taking. Dr. Marry Guan notified placed orders for safety sitter.Sitter at bedside.

## 2016-06-19 NOTE — Evaluation (Addendum)
  Occupational Therapy Evaluation Patient Details Name: Heather Colon MRN: [id number] DOB: Mar 12, 1933 Today's Date: 06/19/2016

## 2016-06-19 NOTE — Anesthesia Postprocedure Evaluation (Signed)
Anesthesia Post Note  Patient: Heather Colon  Procedure(s) Performed: Procedure(s) (LRB): COMPUTER ASSISTED TOTAL KNEE ARTHROPLASTY (Right)  Patient location during evaluation: Nursing Unit Anesthesia Type: Spinal Level of consciousness: awake, awake and alert, oriented and patient cooperative Pain management: pain level controlled Vital Signs Assessment: post-procedure vital signs reviewed and stable Respiratory status: spontaneous breathing, nonlabored ventilation and respiratory function stable Cardiovascular status: blood pressure returned to baseline and stable Postop Assessment: no headache and no backache Anesthetic complications: no    Last Vitals:  Vitals:   06/19/16 0438 06/19/16 0722  BP: (!) 116/46 (!) 130/48  Pulse: (!) 57 (!) 50  Resp: 16 18  Temp: 36.5 C 36.4 C    Last Pain:  Vitals:   06/19/16 0724  TempSrc:   PainSc: 6       LLE Sensation: Full sensation (06/19/16 0727)   RLE Sensation: Full sensation (06/19/16 0727)      [name]

## 2016-06-19 NOTE — Care Management Note (Signed)
Case Management Note  Patient Details  Name: Heather Colon MRN: [id number] Date of Birth: 09/28/1933  Subjective/Objective:  Spoke with patient for discharge planning. Patient is from home alone alert and answers questions appropriately, Patient stated that she had planned on going to [facility] for Rehab at discharge. PT is recommending home with home health. Patient ambulated 80 feet on operative day and will not meet for inpatient PT. This was explained by myself and physical therapist who was present in room. Patient stated that she would be able to have her sister stay with her for a few days and that she also has two adult sons and a daughter in law that would be able to assist her as well. List of Home Health providers given to patient for review and she chose to go with [facility]. Discussed with patient need to administer Lovenox shots to herself at home, Patients nurse was present in room and stated that she would continue teaching and allow the patient to administer injections to herself while here. Patients Lovenox order was called to her pharmacy [facility] [phone]) spoke with pharmacist Yvone Neu)  Lovenox 40mg  injection once daily for 14 days no refills. Co pay is $60.  Orders for Rolling Walker and Bedside commode placed with [place] and will be delivered to room prior to discharge. No other CM needs identified. Continue to follow.                Action/Plan: Home with Home Health and family support.  Expected Discharge Date:  06/21/16               Expected Discharge Plan:  [place]  In-House Referral:     Discharge planning Services  CM Consult  Post Acute Care Choice:  Durable Medical Equipment Choice offered to:  Patient  DME Arranged:  3-N-1Gilford Rile DME Agency:  [place]:  PT [place]:  [facility] (now [facility])  Status of Service:  In process, will continue to follow  If discussed  at Long Length of Stay Meetings, dates discussed:    Additional Comments:  Alvie Heidelberg, RN 06/19/2016, 10:47 AM

## 2016-06-20 LAB — CBC
HCT: 28.9 % — ABNORMAL LOW (ref 35.0–47.0)
HEMOGLOBIN: 10.3 g/dL — AB (ref 12.0–16.0)
MCH: 31.7 pg (ref 26.0–34.0)
MCHC: 35.6 g/dL (ref 32.0–36.0)
MCV: 89.1 fL (ref 80.0–100.0)
Platelets: 162 10*3/uL (ref 150–440)
RBC: 3.24 MIL/uL — AB (ref 3.80–5.20)
RDW: 13.1 % (ref 11.5–14.5)
WBC: 12.9 10*3/uL — AB (ref 3.6–11.0)

## 2016-06-20 LAB — BASIC METABOLIC PANEL
ANION GAP: 10 (ref 5–15)
BUN: 22 mg/dL — ABNORMAL HIGH (ref 6–20)
CALCIUM: 8.2 mg/dL — AB (ref 8.9–10.3)
CO2: 22 mmol/L (ref 22–32)
CREATININE: 1.02 mg/dL — AB (ref 0.44–1.00)
Chloride: 100 mmol/L — ABNORMAL LOW (ref 101–111)
GFR, EST AFRICAN AMERICAN: 58 mL/min — AB (ref >60)
GFR, EST NON AFRICAN AMERICAN: 50 mL/min — AB (ref >60)
Glucose, Bld: 112 mg/dL — ABNORMAL HIGH (ref 65–99)
Potassium: 3.1 mmol/L — ABNORMAL LOW (ref 3.5–5.1)
SODIUM: 132 mmol/L — AB (ref 135–145)

## 2016-06-20 MED ORDER — POTASSIUM CHLORIDE CRYS ER 20 MEQ PO TBCR: 20.0000 meq | EXTENDED_RELEASE_TABLET | Freq: Two times a day (BID) | ORAL | Status: DC

## 2016-06-20 MED ORDER — ENOXAPARIN SODIUM 40 MG/0.4ML ~~LOC~~ SOLN: 40.0000 mg | SUBCUTANEOUS | 0 refills | Status: DC

## 2016-06-20 MED ORDER — LACTULOSE 10 GM/15ML PO SOLN
10.0000 g | Freq: Two times a day (BID) | ORAL | Status: DC | PRN
  Administered 2016-06-20: 10 g via ORAL
  Filled 2016-06-20: qty 30

## 2016-06-20 MED ORDER — POTASSIUM CHLORIDE 20 MEQ PO PACK
20.0000 meq | PACK | Freq: Three times a day (TID) | ORAL | Status: DC
  Administered 2016-06-20: 20 meq via ORAL
  Filled 2016-06-20: qty 1

## 2016-06-20 MED ORDER — TRAMADOL HCL 50 MG PO TABS: 50.0000 mg | ORAL_TABLET | ORAL | 1 refills | Status: DC | PRN

## 2016-06-20 NOTE — Progress Notes (Signed)
Physical Therapy Treatment Patient Details Name: Heather Colon MRN: [id number] DOB: [birth year] Today's Date: 06/20/2016    History of Present Illness Patient is POD#1 on 8/28 for R TKR     PT Comments    Ready for session.  Participated in exercises as described below.  She was able to transition out of bed without assist.  Stood with min guard and was able to ambulate around nursing station with walker and without loss of balance of knee buckling.  Up/down 4 steps with 1 rial without difficulty.  Pt tolerated session well.   Follow Up Recommendations  Home health PT     Equipment Recommendations  Rolling walker with 5" wheels    Recommendations for Other Services       Precautions / Restrictions Precautions Precautions: Fall Restrictions Weight Bearing Restrictions: Yes RLE Weight Bearing: Weight bearing as tolerated    Mobility  Bed Mobility Overal bed mobility: Modified Independent Bed Mobility: Supine to Sit     Supine to sit: Modified independent (Device/Increase time)        Transfers Overall transfer level: Needs assistance Equipment used: Rolling walker (2 wheeled) Transfers: Sit to/from Stand Sit to Stand: Min guard         General transfer comment: good safety  Ambulation/Gait Ambulation/Gait assistance: Min guard Ambulation Distance (Feet): 200 Feet Assistive device: Rolling walker (2 wheeled) Gait Pattern/deviations: Step-to pattern   Gait velocity interpretation: <1.8 ft/sec, indicative of risk for recurrent falls General Gait Details: speed and step length increased with distance   Stairs Stairs: Yes Stairs assistance: Min guard Stair Management: One rail Right Number of Stairs: 4    Wheelchair Mobility    Modified Rankin (Stroke Patients Only)       Balance Overall balance assessment: Modified Independent Sitting-balance support: No upper extremity supported Sitting balance-Leahy Scale: Good     Standing balance support:  Bilateral upper extremity supported Standing balance-Leahy Scale: Good                      Cognition Arousal/Alertness: Awake/alert Behavior During Therapy: WFL for tasks assessed/performed Overall Cognitive Status: Within Functional Limits for tasks assessed                      Exercises Total Joint Exercises Ankle Circles/Pumps: AROM;Both;10 reps;Seated Gluteal Sets: AROM;Both;10 reps Heel Slides: AROM;Right;10 reps;Seated Hip ABduction/ADduction: AROM;Right;10 reps;Seated Straight Leg Raises: Strengthening;Right;10 reps;Seated Long Arc Quad: AROM;Right;10 reps;Seated Goniometric ROM: 4-82  Difficult to measure with bulky dressing.    General Comments        Pertinent Vitals/Pain Pain Assessment: 0-10 Pain Score: 10-Worst pain ever Pain Location: R knee Pain Descriptors / Indicators: Aching Pain Intervention(s): Limited activity within patient's tolerance;RN gave pain meds during session    Home Living                      Prior Function            PT Goals (current goals can now be found in the care plan section) Acute Rehab PT Goals Patient Stated Goal: To return home Progress towards PT goals: Progressing toward goals    Frequency  BID    PT Plan Current plan remains appropriate    Co-evaluation             End of Session Equipment Utilized During Treatment: Gait belt Activity Tolerance: Patient tolerated treatment well Patient left: in chair;with call bell/phone within reach;with chair alarm  set;with family/visitor present;with nursing/sitter in room (polar care)     Time: [phone] PT Time Calculation (min) (ACUTE ONLY): 32 min  Charges:  $Gait Training: 8-22 mins $Therapeutic Exercise: 8-22 mins                    G Codes:      [name] July 17, 2016, 9:59 AM

## 2016-06-20 NOTE — Discharge Instructions (Signed)

## 2016-06-20 NOTE — Discharge Summary (Signed)
Physician Discharge Summary  Patient ID: Rivers Kerscher MRN: [id number] DOB/AGE: Nov 06, 1932 82 y.o.  Admit date: 06/18/2016 Discharge date: 06/20/2016  Admission Diagnoses:  OSTEOARTHRITIS   Discharge Diagnoses: Patient Active Problem List   Diagnosis Date Noted  . S/P total knee arthroplasty 06/18/2016    Past Medical History:  Diagnosis Date  . Arthritis   . Chronic kidney disease    phase 3  . Coronary artery disease   . Glaucoma    both eyes  . Hypertension   . Hypothyroidism   . Osteoporosis      Transfusion: autovac transfusion was given the first 6 hours post op   Consultants (if any): case management for home health assistant   Discharged Condition: Improved  Hospital Course: Sage Kinlaw is an 82 y.o. female who was admitted 06/18/2016 with a diagnosis of degenerative arthrosis of right knee and went to the operating room on 06/18/2016 and underwent the above named procedures.    Surgeries:Procedure(s): COMPUTER ASSISTED TOTAL KNEE ARTHROPLASTY on 06/18/2016  PRE-OPERATIVE DIAGNOSIS: Degenerative arthrosis of the right knee, primary  POST-OPERATIVE DIAGNOSIS:  Same  PROCEDURE:  Right total knee arthroplasty using computer-assisted navigation  SURGEON:  Marciano Sequin. M.D.  ASSISTANT:  Rachelle Hora, PA-C (present and scrubbed throughout the case, critical for assistance with exposure, retraction, instrumentation, and closure)  ANESTHESIA: spinal  ESTIMATED BLOOD LOSS: 50 mL  FLUIDS REPLACED: 900 mL of crystalloid  TOURNIQUET TIME: 90 minutes  DRAINS: 2 medium drains to a reinfusion system  SOFT TISSUE RELEASES: Anterior cruciate ligament, posterior cruciate ligament, deep and superficial medial collateral ligament, patellofemoral ligament  IMPLANTS UTILIZED: DePuy Attune size 6N (narrow) posterior stabilized femoral component (cemented), size 4 rotating platform tibial component (cemented), 35 mm medialized dome patella (cemented),  and a 5 mm stabilized rotating platform polyethylene insert.  INDICATIONS FOR SURGERY: Rylea Rinner is a 82 y.o. year old female with a long history of progressive knee pain. X-rays demonstrated severe degenerative changes in tricompartmental fashion. The patient had not seen any significant improvement despite conservative nonsurgical intervention. After discussion of the risks and benefits of surgical intervention, the patient expressed understanding of the risks benefits and agree with plans for total knee arthroplasty.   The risks, benefits, and alternatives were discussed at length including but not limited to the risks of infection, bleeding, nerve injury, stiffness, blood clots, the need for revision surgery, cardiopulmonary complications, among others, and they were willing to proceed.  Patient tolerated the surgery well. No complications .Patient was taken to PACU where she was stabilized and then transferred to the orthopedic floor.  Patient started on Lovenox 30 q 12 hrs. Foot pumps applied bilaterally at 80 mm hgb. Heels elevated off bed with rolled towels. No evidence of DVT. Calves non tender. Negative Homan. Physical therapy started on day #1 for gait training and transfer with OT starting on  day #1 for ADL and assisted devices. Patient has done well with therapy. Ambulated 200 feet upon being discharged. Was able to go up and down 4 steps independently and safely. Patient's IV and foley were discontinued on day #1 and the hemovac was d/c on day #2. Dressing changed on day #2   She was given perioperative antibiotics:  Anti-infectives    Start     Dose/Rate Route Frequency Ordered Stop   06/18/16 1400  ceFAZolin (ANCEF) IVPB 2g/100 mL premix     2 g 200 mL/hr over 30 Minutes Intravenous Every 6 hours 06/18/16 1230 06/19/16 0804  06/18/16 0630  ceFAZolin (ANCEF) IVPB 2g/100 mL premix     2 g 200 mL/hr over 30 Minutes Intravenous  Once 06/18/16 0627 06/18/16 0755   06/18/16  0600  ceFAZolin (ANCEF) 2-4 GM/100ML-% IVPB    Comments:  Slemenda, Debbie: cabinet override      06/18/16 0600 06/18/16 1814    .  She was fitted with AV 1 compression foot pumps devices, instructed on heel pumps, early ambulation, and TED stocking  for DVT prophylaxis.  She benefited maximally from the hospital stay and there were no complications.    Recent vital signs:  Vitals:   06/20/16 0359 06/20/16 0745  BP: (!) 174/67 (!) 158/48  Pulse: 94 94  Resp: 19 18  Temp: 98.5 F (36.9 C) 98.2 F (36.8 C)    Recent laboratory studies:  Lab Results  Component Value Date   HGB 10.3 (L) 06/20/2016   HGB 10.2 (L) 06/19/2016   HGB 12.6 06/07/2016   Lab Results  Component Value Date   WBC 12.9 (H) 06/20/2016   PLT 162 06/20/2016   Lab Results  Component Value Date   INR 1.07 06/07/2016   Lab Results  Component Value Date   NA 132 (L) 06/20/2016   K 3.1 (L) 06/20/2016   CL 100 (L) 06/20/2016   CO2 22 06/20/2016   BUN 22 (H) 06/20/2016   CREATININE 1.02 (H) 06/20/2016   GLUCOSE 112 (H) 06/20/2016    Discharge Medications:     Medication List    STOP taking these medications   aspirin 325 MG tablet   aspirin 81 MG tablet     TAKE these medications   acetaminophen 325 MG tablet Commonly known as:  TYLENOL Take 325 mg by mouth every 4 (four) hours as needed for moderate pain.   bimatoprost 0.01 % Soln Commonly known as:  LUMIGAN Place 1 drop into both eyes at bedtime.   cholecalciferol 1000 units tablet Commonly known as:  VITAMIN D Take 2,000 Units by mouth every morning.   enoxaparin 40 MG/0.4ML injection Commonly known as:  LOVENOX Inject 0.4 mLs (40 mg total) into the skin daily.   fluocinonide ointment 0.05 % Commonly known as:  LIDEX Apply 1 application topically daily as needed (rash).   ibandronate 150 MG tablet Commonly known as:  BONIVA Take 150 mg by mouth every 30 (thirty) days. Take in the morning with a full glass of water, on an  empty stomach, and do not take anything else by mouth or lie down for the next 30 min.   liothyronine 25 MCG tablet Commonly known as:  CYTOMEL Take 25-37.5 mcg by mouth See admin instructions. 37.5mg  on Mondays and Fridays, and 25mg  all other days   losartan-hydrochlorothiazide 50-12.5 MG tablet Commonly known as:  HYZAAR Take 1 tablet by mouth every morning.   multivitamin with minerals tablet Take 1 tablet by mouth every morning.   SYSTANE 0.4-0.3 % Soln Generic drug:  Polyethyl Glycol-Propyl Glycol Place 1 drop into both eyes daily as needed (for dry eyes).   traMADol 50 MG tablet Commonly known as:  ULTRAM Take 1-2 tablets (50-100 mg total) by mouth every 4 (four) hours as needed for moderate pain.   Verapamil HCl CR 200 MG Cp24 Take 1 capsule by mouth at bedtime.   VOLTAREN 1 % Gel Generic drug:  diclofenac sodium Apply 2 g topically as needed (pain).       Diagnostic Studies: Dg Knee Right Port  Result Date: 06/18/2016 CLINICAL DATA:  Knee osteoarthritis. Status post total knee arthroplasty. EXAM: PORTABLE RIGHT KNEE - 1-2 VIEW COMPARISON:  None. FINDINGS: Total knee arthroplasty is seen with all 3 components in expected position. No evidence of fracture or dislocation. Surgical drains noted as well as overlying skin staples. IMPRESSION: Expected postoperative appearance of total knee arthroplasty. No evidence of fracture or dislocation. Electronically Signed   By: Earle Gell M.D.   On: 06/18/2016 11:36    Disposition: Final discharge disposition not confirmed  Discharge Instructions    Diet - low sodium heart healthy    Complete by:  As directed   Diet - low sodium heart healthy    Complete by:  As directed   Increase activity slowly    Complete by:  As directed   Increase activity slowly    Complete by:  As directed      Follow-up Information    WOLFE,JON R., PA On 07/03/2016.   Specialty:  Physician Assistant Why:  at 9:45am Contact information: [place] [address] [phone]        Dereck Leep, MD On 08/07/2016.   Specialty:  Orthopedic Surgery Why:  at 2:15pm Contact information: [place] [address] [phone]            Signed: Watt Climes. 06/20/2016, 10:33 AM

## 2016-06-20 NOTE — Progress Notes (Signed)
Patient discharge summary reviewed with patient and son, with verbal understanding. VSS at the time of discharge. 1 narcotic Rx given upon discharge. Lovenox education completed and reminded patient of Rx to pick up. Ecsorted OOF via Government social research officer via wc and secured in car.

## 2016-06-20 NOTE — Progress Notes (Signed)
Occupational Therapy Treatment Patient Details Name: Jarilyn Holian MRN: [id number] DOB: 05-18-1933 Today's Date: 06/20/2016    History of present illness Pt. was admitted for a Right TKR   OT comments  Pt. Continues to benefit from skilled OT services to work on improving ADL functioning including: A/E training for LE dressing, functional mobility for toileting, and pt/family education about work simplification techniques for ADLs, and IADLs. Pt. Plans to return home with family assist as needed.   Follow Up Recommendations  Home health OT    Equipment Recommendations       Recommendations for Other Services Rehab consult    Precautions / Restrictions Precautions Precautions: Fall Restrictions Weight Bearing Restrictions: Yes RLE Weight Bearing: Weight bearing as tolerated       Mobility Bed Mobility Overal bed mobility: Modified Independent Bed Mobility: Supine to Sit     Supine to sit: Modified independent (Device/Increase time)        Transfers Overall transfer level: Needs assistance Equipment used: Rolling walker (2 wheeled) Transfers: Sit to/from Stand Sit to Stand: Min guard         General transfer comment: good safety    Balance Overall balance assessment: Modified Independent Sitting-balance support: No upper extremity supported Sitting balance-Leahy Scale: Good     Standing balance support: Bilateral upper extremity supported Standing balance-Leahy Scale: Good                     ADL Overall ADL's : Independent Eating/Feeding: Set up   Grooming: Set up               Lower Body Dressing: Min guard               Functional mobility during ADLs: Min guard        Vision                     Perception     Praxis      Cognition   Behavior During Therapy: WFL for tasks assessed/performed Overall Cognitive Status: Within Functional Limits for tasks assessed                        Extremity/Trunk Assessment               Exercises Total Joint Exercises Ankle Circles/Pumps: AROM;Both;10 reps;Seated Gluteal Sets: AROM;Both;10 reps Heel Slides: AROM;Right;10 reps;Seated Hip ABduction/ADduction: AROM;Right;10 reps;Seated Straight Leg Raises: Strengthening;Right;10 reps;Seated Long Arc Quad: AROM;Right;10 reps;Seated Goniometric ROM: 4-82   Shoulder Instructions       General Comments      Pertinent Vitals/ Pain       Pain Assessment: 0-10 Pain Score: 10-Worst pain ever Pain Location: Right knee Pain Descriptors / Indicators: Aching Pain Intervention(s): Limited activity within patient's tolerance  Home Living                                          Prior Functioning/Environment              Frequency Min 1X/week     Progress Toward Goals  OT Goals(current goals can now be found in the care plan section)  Progress towards OT goals: Progressing toward goals  Acute Rehab OT Goals Patient Stated Goal: To return home OT Goal Formulation: With patient Potential to Achieve Goals: Good  Plan  Co-evaluation                 End of Session     Activity Tolerance Patient tolerated treatment well   Patient Left in bed;with nursing/sitter in room   Nurse Communication          Time: 1100-1115 OT Time Calculation (min): 15 min  Charges: OT Treatments $Self Care/Home Management : 8-22 mins  Harrel Carina 06/20/2016, 12:01 PM

## 2016-06-20 NOTE — Care Management Important Message (Signed)
Important Message  Patient Details  Name: Heather Colon MRN: [id number] Date of Birth: Apr 30, 1933   Medicare Important Message Given:  N/A - LOS <3 / Initial given by admissions    Alvie Heidelberg, RN 06/20/2016, 11:01 AM

## 2016-06-20 NOTE — Care Management Note (Signed)
Case Management Note  Patient Details  Name: Summit Stronach MRN: [id number] Date of Birth: February 08, 1933  Subjective/Objective:                    Action/Plan: Patient will discharge home today with [place], [facility] notified of discharge. No other Discharge needs identified. Will sign off. Expected Discharge Date:  06/21/16               Expected Discharge Plan:  [place]  In-House Referral:     Discharge planning Services  CM Consult  Post Acute Care Choice:  Durable Medical Equipment Choice offered to:  Patient  DME Arranged:  3-N-1Gilford Rile DME Agency:  [place]:  PT [place]:  [facility] ([facility])  Status of Service:  Completed, signed off  If discussed at [place] of Stay Meetings, dates discussed:    Additional Comments:  Alvie Heidelberg, RN 06/20/2016, 10:46 AM

## 2016-06-20 NOTE — Progress Notes (Signed)
   Subjective: 2 Days Post-Op Procedure(s) (LRB): COMPUTER ASSISTED TOTAL KNEE ARTHROPLASTY (Right) Patient reports pain as mild.   Patient is well, and has had no acute complaints or problems Continue with physical  therapy today.  Plan is to go Home after hospital stay. no nausea and no vomiting Patient denies any chest pains or shortness of breath. Objective: Vital signs in last 24 hours: Temp:  [97.5 F (36.4 C)-98.5 F (36.9 C)] 98.5 F (36.9 C) (08/30 0359) Pulse Rate:  [50-94] 94 (08/30 0359) Resp:  [16-19] 19 (08/30 0359) BP: (126-174)/(47-67) 174/67 (08/30 0359) SpO2:  [95 %-99 %] 95 % (08/30 0359) well approximated incision Heels are non tender and elevated off the bed using rolled towels Intake/Output from previous day: 08/29 0701 - 08/30 0700 In: 1943.3 [P.O.:880; I.V.:1063.3] Out: 116 [Urine:1; Drains:115] Intake/Output this shift: No intake/output data recorded.   Recent Labs  06/19/16 0541 06/20/16 0349  HGB 10.2* 10.3*    Recent Labs  06/19/16 0541 06/20/16 0349  WBC 9.0 12.9*  RBC 3.25* 3.24*  HCT 28.9* 28.9*  PLT 129* 162    Recent Labs  06/19/16 0541 06/20/16 0349  NA 134* 132*  K 3.6 3.1*  CL 102 100*  CO2 25 22  BUN 22* 22*  CREATININE 1.09* 1.02*  GLUCOSE 98 112*  CALCIUM 8.0* 8.2*   No results for input(s): LABPT, INR in the last 72 hours.  EXAM General - Patient is Alert, Appropriate and Oriented Extremity - Neurologically intact ABD soft Neurovascular intact Sensation intact distally Intact pulses distally Dorsiflexion/Plantar flexion intact Dressing - dressing C/D/I Motor Function - intact, moving foot and toes well on exam.    Past Medical History:  Diagnosis Date  . Arthritis   . Chronic kidney disease    phase 3  . Coronary artery disease   . Glaucoma    both eyes  . Hypertension   . Hypothyroidism   . Osteoporosis     Assessment/Plan: 2 Days Post-Op Procedure(s) (LRB): COMPUTER ASSISTED TOTAL KNEE  ARTHROPLASTY (Right) Active Problems:   S/P total knee arthroplasty  Estimated body mass index is 18.89 kg/m as calculated from the following:   Height as of this encounter: 5\' 1"  (1.549 m).   Weight as of this encounter: 45.4 kg (100 lb). Up with therapy Discharge home with home health this pm  Labs: reviewed. K+ 3.1 Add klor con  DVT Prophylaxis - Lovenox, Foot Pumps and TED hose Weight-Bearing as tolerated to right leg hemovac discontinued Dressing changed  Cambry Spampinato R. [employer] 06/20/2016, 7:13 AM

## 2016-12-05 IMAGING — DX DG KNEE 1-2V PORT*R*
2 series · 2 of 2 positions shown · non-contrast
Comparison: None.

CLINICAL DATA: Knee osteoarthritis. Status post total knee
arthroplasty.

EXAM:
PORTABLE RIGHT KNEE - 1-2 VIEW

[knee ap]
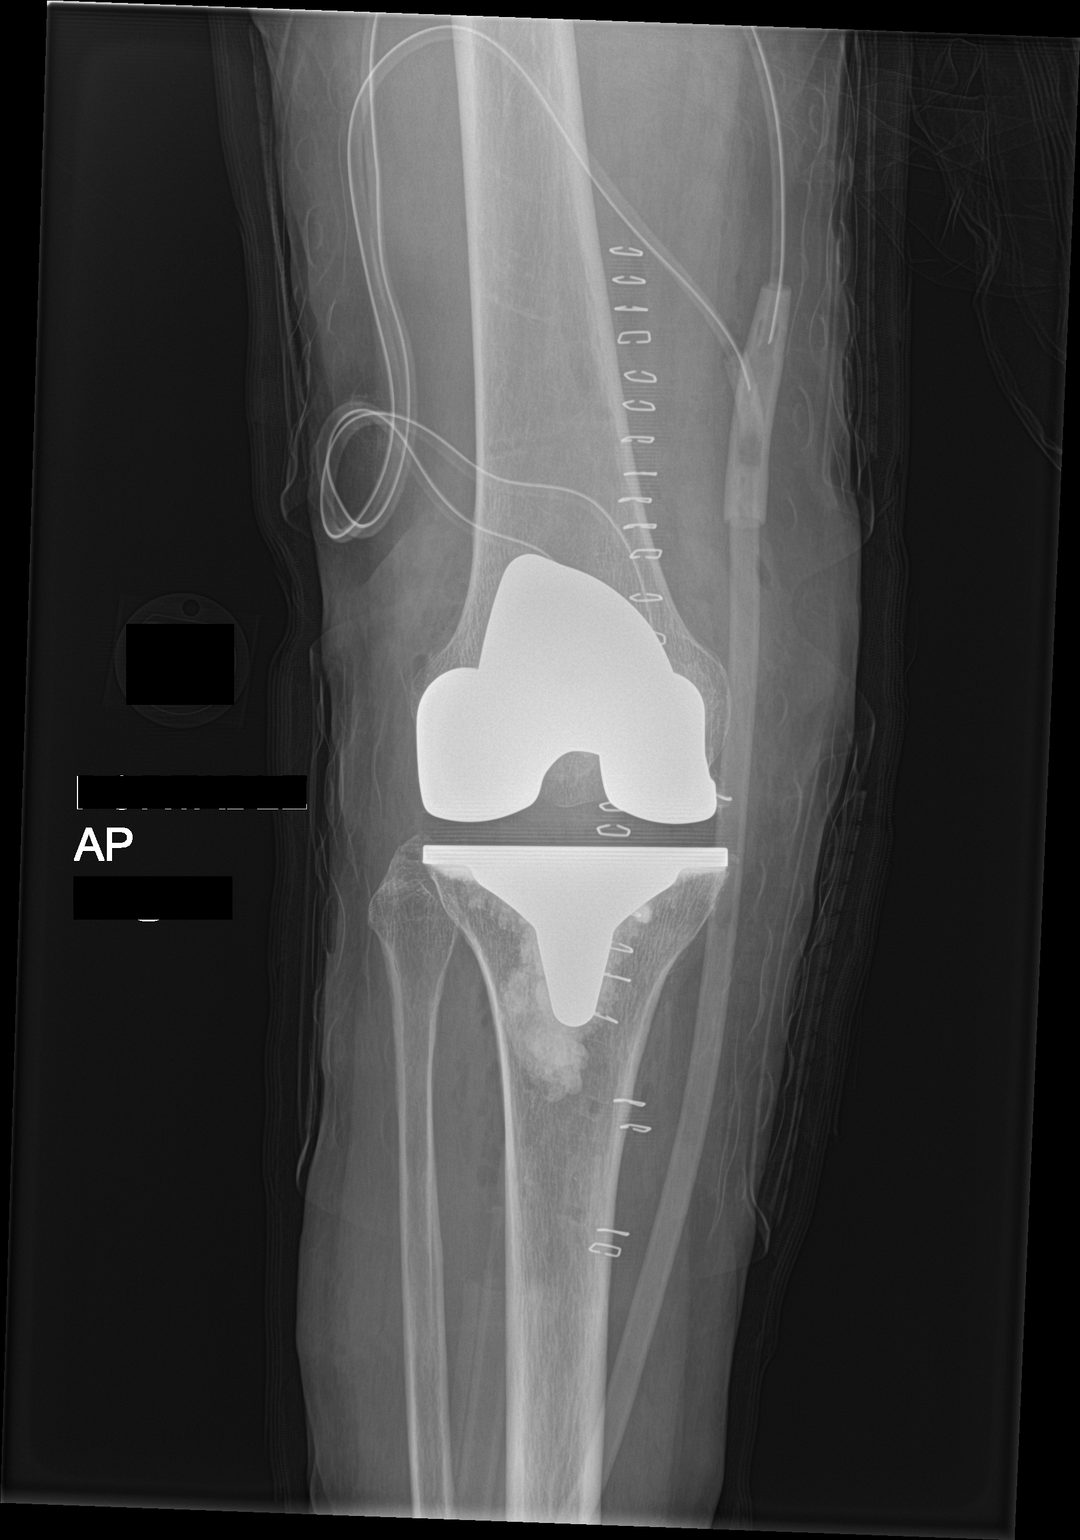

[knee lat]
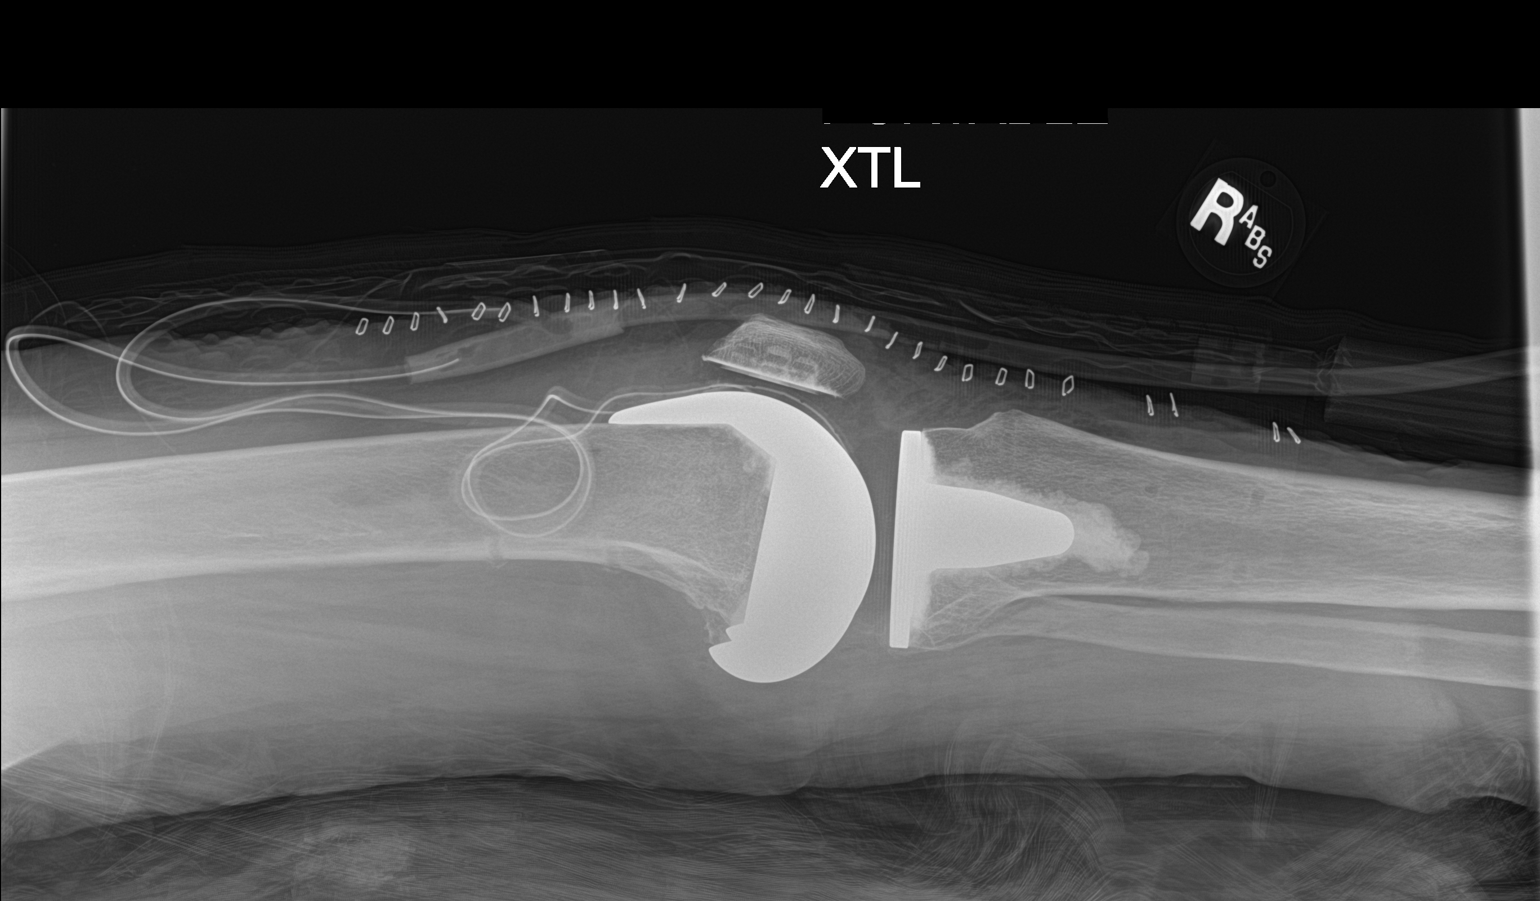

[2 of 2 positions shown; findings below may reference images not displayed]

FINDINGS: Total knee arthroplasty is seen with all 3 components in expected
position. No evidence of fracture or dislocation. Surgical drains
noted as well as overlying skin staples.
IMPRESSION: Expected postoperative appearance of total knee arthroplasty. No
evidence of fracture or dislocation.

## 2017-02-17 NOTE — Discharge Instructions (Signed)
°  Instructions after Total Knee Replacement ° ° Heather Colon, Jr., M.D.    ° Dept. of Orthopaedics & Sports Medicine ° [facility] ° [address] °  [place], [place]  [address] ° Phone: [phone]   Fax: [phone] ° °  °DIET: °• Drink plenty of non-alcoholic fluids. °• Resume your normal diet. Include foods high in fiber. ° °ACTIVITY:  °• You may use crutches or a walker with weight-bearing as tolerated, unless instructed otherwise. °• You may be weaned off of the walker or crutches by your Physical Therapist.  °• Do NOT place pillows under the knee. Anything placed under the knee could limit your ability to straighten the knee.   °• Continue doing gentle exercises. Exercising will reduce the pain and swelling, increase motion, and prevent muscle weakness.   °• Please continue to use the TED compression stockings for 6 weeks. You may remove the stockings at night, but should reapply them in the morning. °• Do not drive or operate any equipment until instructed. ° °WOUND CARE:  °• Continue to use the PolarCare or ice packs periodically to reduce pain and swelling. °• You may bathe or shower after the staples are removed at the first office visit following surgery. ° °MEDICATIONS: °• You may resume your regular medications. °• Please take the pain medication as prescribed on the medication. °• Do not take pain medication on an empty stomach. °• You have been given a prescription for a blood thinner (Lovenox or Coumadin). Please take the medication as instructed. (NOTE: After completing a 2 week course of Lovenox, take one Enteric-coated aspirin once a day. This along with elevation will help reduce the possibility of phlebitis in your operated leg.) °• Do not drive or drink alcoholic beverages when taking pain medications. ° °CALL THE OFFICE FOR: °• Temperature above 101 degrees °• Excessive bleeding or drainage on the dressing. °• Excessive swelling, coldness, or paleness of the toes. °• Persistent  nausea and vomiting. ° °FOLLOW-UP:  °• You should have an appointment to return to the office in 10-14 days after surgery. °• Arrangements have been made for continuation of Physical Therapy (either home therapy or outpatient therapy). °  °

## 2017-05-28 ENCOUNTER — Encounter
Admission: RE | Admit: 2017-05-28 | Discharge: 2017-05-28 | Disposition: A | Discharge status: Home / Self Care | Payer: Medicare Other | Source: Ambulatory Visit | Attending: Orthopedic Surgery | Admitting: Orthopedic Surgery

## 2017-05-28 DIAGNOSIS — I4891 Unspecified atrial fibrillation: Secondary | ICD-10-CM | POA: Insufficient documentation

## 2017-05-28 DIAGNOSIS — Z01812 Encounter for preprocedural laboratory examination: Secondary | ICD-10-CM | POA: Insufficient documentation

## 2017-05-28 DIAGNOSIS — I10 Essential (primary) hypertension: Secondary | ICD-10-CM | POA: Diagnosis not present

## 2017-05-28 DIAGNOSIS — Z01810 Encounter for preprocedural cardiovascular examination: Secondary | ICD-10-CM | POA: Insufficient documentation

## 2017-05-28 DIAGNOSIS — I517 Cardiomegaly: Secondary | ICD-10-CM | POA: Diagnosis not present

## 2017-05-28 HISTORY — DX: Malignant (primary) neoplasm, unspecified: C80.1

## 2017-05-28 LAB — TYPE AND SCREEN
ABO/RH(D): A POS
Antibody Screen: NEGATIVE

## 2017-05-28 LAB — COMPREHENSIVE METABOLIC PANEL
ALBUMIN: 4.2 g/dL (ref 3.5–5.0)
ALK PHOS: 50 U/L (ref 38–126)
ALT: 11 U/L — AB (ref 14–54)
ANION GAP: 9 (ref 5–15)
AST: 17 U/L (ref 15–41)
BUN: 26 mg/dL — ABNORMAL HIGH (ref 6–20)
CHLORIDE: 102 mmol/L (ref 101–111)
CO2: 25 mmol/L (ref 22–32)
CREATININE: 1.15 mg/dL — AB (ref 0.44–1.00)
Calcium: 9.3 mg/dL (ref 8.9–10.3)
GFR calc non Af Amer: 43 mL/min — ABNORMAL LOW (ref >60)
GFR, EST AFRICAN AMERICAN: 50 mL/min — AB (ref >60)
GLUCOSE: 88 mg/dL (ref 65–99)
Potassium: 4 mmol/L (ref 3.5–5.1)
SODIUM: 136 mmol/L (ref 135–145)
Total Bilirubin: 1.4 mg/dL — ABNORMAL HIGH (ref 0.3–1.2)
Total Protein: 7.6 g/dL (ref 6.5–8.1)

## 2017-05-28 LAB — C-REACTIVE PROTEIN: CRP: <0.8 mg/dL (ref <1.0)

## 2017-05-28 LAB — URINALYSIS, ROUTINE W REFLEX MICROSCOPIC
Bacteria, UA: NONE SEEN
Bilirubin Urine: NEGATIVE
GLUCOSE, UA: NEGATIVE mg/dL
Ketones, ur: NEGATIVE mg/dL
Nitrite: NEGATIVE
PH: 5 (ref 5.0–8.0)
Protein, ur: NEGATIVE mg/dL
SPECIFIC GRAVITY, URINE: 1.009 (ref 1.005–1.030)

## 2017-05-28 LAB — CBC
HCT: 38.2 % (ref 35.0–47.0)
HEMOGLOBIN: 13.1 g/dL (ref 12.0–16.0)
MCH: 31.3 pg (ref 26.0–34.0)
MCHC: 34.3 g/dL (ref 32.0–36.0)
MCV: 91.4 fL (ref 80.0–100.0)
PLATELETS: 219 10*3/uL (ref 150–440)
RBC: 4.18 MIL/uL (ref 3.80–5.20)
RDW: 13.6 % (ref 11.5–14.5)
WBC: 6.3 10*3/uL (ref 3.6–11.0)

## 2017-05-28 LAB — SURGICAL PCR SCREEN
MRSA, PCR: NEGATIVE
Staphylococcus aureus: NEGATIVE

## 2017-05-28 LAB — APTT: APTT: 33 s (ref 24–36)

## 2017-05-28 LAB — SEDIMENTATION RATE: SED RATE: 22 mm/h (ref 0–30)

## 2017-05-28 LAB — PROTIME-INR
INR: 1.16
Prothrombin Time: 14.9 seconds (ref 11.4–15.2)

## 2017-05-28 NOTE — Patient Instructions (Signed)
  Your procedure is scheduled on: June 10, 2017 [facility]) Report to Same Day Surgery 2nd floor medical mall ([place] Entrance-take elevator on left to 2nd floor.  Check in with surgery information desk.) To find out your arrival time please call [phone] between 1PM - 3PM on June 07, 2017 (FRIDAY)   Remember: Instructions that are not followed completely may result in serious medical risk, up to and including death, or upon the discretion of your surgeon and anesthesiologist your surgery may need to be rescheduled.    _x___ 1. Do not eat food or drink liquids after midnight. No gum chewing or hard candies                             __x__ 2. No Alcohol for 24 hours before or after surgery.   __x__3. No Smoking for 24 prior to surgery.   ____  4. Bring all medications with you on the day of surgery if instructed.    __x__ 5. Notify your doctor if there is any change in your medical condition     (cold, fever, infections).     Do not wear jewelry, make-up, hairpins, clips or nail polish.  Do not wear lotions, powders, or perfumes.   Do not shave 48 hours prior to surgery. Men may shave face and neck.  Do not bring valuables to the hospital.    [facility] is not responsible for any belongings or valuables.               Contacts, dentures or bridgework may not be worn into surgery.  Leave your suitcase in the car. After surgery it may be brought to your room.  For patients admitted to the hospital, discharge time is determined by your treatment team                        Patients discharged the day of surgery will not be allowed to drive home.  You will need someone to drive you home and stay with you the night of your procedure.    Please read over the following fact sheets that you were given:   [facility] Preparing for Surgery and or MRSA Information   TAKE THE FOLLOWING MEDICATION WITH A SIP OF WATER THE MORNING OF SURGERY :  1.  LIOTHYRONINE  2.  3.  4.  5.  6.  ____Fleets enema or Magnesium Citrate as directed.   _x___ Use CHG Soap or sage wipes as directed on instruction sheet   ____ Use inhalers on the day of surgery and bring to hospital day of surgery  ____ Stop Metformin and Janumet 2 days prior to surgery.    ____ Take 1/2 of usual insulin dose the night before surgery and none on the morning surgery     _x___ Follow recommendations from Cardiologist, Pulmonologist or PCP regarding          stopping Aspirin, Coumadin, Plavix ,Eliquis, Effient, or Pradaxa, and Pletal. (STOP ASPIRIN ONE WEEK PRIOR TO SURGERY )  X____Stop Anti-inflammatories such as Advil, Aleve, Ibuprofen, Motrin, Naproxen, Naprosyn, Goodies powders or aspirin products. OK to take Tylenol  (STOP VOLTAREN GEL ONE WEEK PRIOR TO SURGERY  )   _x___ Stop supplements until after surgery.  But may continue Vitamin D, Vitamin B, and multivitamin         ____ Bring C-Pap to the hospital.

## 2017-05-28 NOTE — Pre-Procedure Instructions (Signed)
CARDIAC CLEARANCE REQUEST/EKG,AS INSTRUCTED BY DR J ADAMS CALLED AND FAXED TO DR HOOTEN'S. LM FOR TIFFANY

## 2017-05-29 LAB — URINE CULTURE: SPECIAL REQUESTS: NORMAL

## 2017-05-29 NOTE — Pre-Procedure Instructions (Signed)
Labs faxed to dr hooten's office

## 2017-06-03 NOTE — Pre-Procedure Instructions (Signed)
TO HAVE ECHO/ HOLTER PRIOR TO CARDIAC CLEARANCE BY DR PARASCHOS. SEEING HIM 06/07/17 AFTER TESTS

## 2017-06-07 NOTE — Pre-Procedure Instructions (Signed)
Cardiac clearance received from Halford Decamp PA-C, [facility]  Cardiologist Office )

## 2017-06-09 MED ORDER — TRANEXAMIC ACID 1000 MG/10ML IV SOLN
1000.0000 mg | INTRAVENOUS | Status: AC
  Administered 2017-06-10: 1000 mg via INTRAVENOUS
  Filled 2017-06-09: qty 10

## 2017-06-09 MED ORDER — CEFAZOLIN SODIUM-DEXTROSE 2-4 GM/100ML-% IV SOLN
2.0000 g | INTRAVENOUS | Status: AC
  Administered 2017-06-10: 2 g via INTRAVENOUS

## 2017-06-10 ENCOUNTER — Encounter
Admission: RE | Disposition: A | Discharge status: Home Health | Payer: Self-pay | Source: Home / Self Care | Attending: Orthopedic Surgery

## 2017-06-10 ENCOUNTER — Inpatient Hospital Stay
Admission: RE | Admit: 2017-06-10 | Discharge: 2017-06-13 | DRG: 469 | Disposition: A | Discharge status: Home Health | Payer: Medicare Other | Attending: Orthopedic Surgery | Admitting: Orthopedic Surgery

## 2017-06-10 ENCOUNTER — Inpatient Hospital Stay: Payer: Medicare Other

## 2017-06-10 ENCOUNTER — Inpatient Hospital Stay: Payer: Medicare Other | Admitting: Anesthesiology

## 2017-06-10 ENCOUNTER — Encounter: Payer: Self-pay | Admitting: Orthopedic Surgery

## 2017-06-10 DIAGNOSIS — Z841 Family history of disorders of kidney and ureter: Secondary | ICD-10-CM

## 2017-06-10 DIAGNOSIS — I129 Hypertensive chronic kidney disease with stage 1 through stage 4 chronic kidney disease, or unspecified chronic kidney disease: Secondary | ICD-10-CM | POA: Diagnosis present

## 2017-06-10 DIAGNOSIS — E890 Postprocedural hypothyroidism: Secondary | ICD-10-CM | POA: Diagnosis present

## 2017-06-10 DIAGNOSIS — H409 Unspecified glaucoma: Secondary | ICD-10-CM | POA: Diagnosis present

## 2017-06-10 DIAGNOSIS — I2510 Atherosclerotic heart disease of native coronary artery without angina pectoris: Secondary | ICD-10-CM | POA: Diagnosis present

## 2017-06-10 DIAGNOSIS — R7989 Other specified abnormal findings of blood chemistry: Secondary | ICD-10-CM | POA: Diagnosis not present

## 2017-06-10 DIAGNOSIS — M1712 Unilateral primary osteoarthritis, left knee: Principal | ICD-10-CM | POA: Diagnosis present

## 2017-06-10 DIAGNOSIS — Z96651 Presence of right artificial knee joint: Secondary | ICD-10-CM | POA: Diagnosis present

## 2017-06-10 DIAGNOSIS — Z79899 Other long term (current) drug therapy: Secondary | ICD-10-CM | POA: Diagnosis not present

## 2017-06-10 DIAGNOSIS — N183 Chronic kidney disease, stage 3 (moderate): Secondary | ICD-10-CM | POA: Diagnosis present

## 2017-06-10 DIAGNOSIS — E860 Dehydration: Secondary | ICD-10-CM | POA: Diagnosis not present

## 2017-06-10 DIAGNOSIS — I482 Chronic atrial fibrillation: Secondary | ICD-10-CM | POA: Diagnosis present

## 2017-06-10 DIAGNOSIS — Z888 Allergy status to other drugs, medicaments and biological substances status: Secondary | ICD-10-CM | POA: Diagnosis not present

## 2017-06-10 DIAGNOSIS — I4892 Unspecified atrial flutter: Secondary | ICD-10-CM | POA: Diagnosis not present

## 2017-06-10 DIAGNOSIS — Z90710 Acquired absence of both cervix and uterus: Secondary | ICD-10-CM | POA: Diagnosis not present

## 2017-06-10 DIAGNOSIS — E785 Hyperlipidemia, unspecified: Secondary | ICD-10-CM | POA: Diagnosis present

## 2017-06-10 DIAGNOSIS — Z7982 Long term (current) use of aspirin: Secondary | ICD-10-CM

## 2017-06-10 DIAGNOSIS — Z96659 Presence of unspecified artificial knee joint: Secondary | ICD-10-CM

## 2017-06-10 DIAGNOSIS — Z8249 Family history of ischemic heart disease and other diseases of the circulatory system: Secondary | ICD-10-CM | POA: Diagnosis not present

## 2017-06-10 DIAGNOSIS — Z85828 Personal history of other malignant neoplasm of skin: Secondary | ICD-10-CM

## 2017-06-10 DIAGNOSIS — M25562 Pain in left knee: Secondary | ICD-10-CM | POA: Diagnosis present

## 2017-06-10 DIAGNOSIS — Z7951 Long term (current) use of inhaled steroids: Secondary | ICD-10-CM

## 2017-06-10 DIAGNOSIS — M810 Age-related osteoporosis without current pathological fracture: Secondary | ICD-10-CM | POA: Diagnosis present

## 2017-06-10 DIAGNOSIS — N170 Acute kidney failure with tubular necrosis: Secondary | ICD-10-CM | POA: Diagnosis not present

## 2017-06-10 DIAGNOSIS — Z885 Allergy status to narcotic agent status: Secondary | ICD-10-CM

## 2017-06-10 HISTORY — PX: KNEE ARTHROPLASTY: SHX992

## 2017-06-10 SURGERY — ARTHROPLASTY, KNEE, TOTAL, USING IMAGELESS COMPUTER-ASSISTED NAVIGATION
Anesthesia: Spinal | Site: Knee | Laterality: Left | Wound class: Clean

## 2017-06-10 MED ORDER — MIDAZOLAM HCL 2 MG/2ML IJ SOLN
INTRAMUSCULAR | Status: AC
  Filled 2017-06-10: qty 2

## 2017-06-10 MED ORDER — GLYCOPYRROLATE 0.2 MG/ML IJ SOLN
INTRAMUSCULAR | Status: AC
Start: 2017-06-10 — End: 2017-06-10
  Filled 2017-06-10: qty 1

## 2017-06-10 MED ORDER — TRIAMCINOLONE ACETONIDE 55 MCG/ACT NA AERO: 1.0000 | INHALATION_SPRAY | Freq: Every day | NASAL | Status: DC

## 2017-06-10 MED ORDER — ACETAMINOPHEN 650 MG RE SUPP
650.0000 mg | Freq: Four times a day (QID) | RECTAL | Status: DC | PRN
Start: 2017-06-10 — End: 2017-06-13

## 2017-06-10 MED ORDER — MAGNESIUM HYDROXIDE 400 MG/5ML PO SUSP
30.0000 mL | Freq: Every day | ORAL | Status: DC | PRN
  Administered 2017-06-12: 30 mL via ORAL
  Filled 2017-06-10: qty 30

## 2017-06-10 MED ORDER — LIOTHYRONINE SODIUM 25 MCG PO TABS: 25.0000 ug | ORAL_TABLET | ORAL | Status: DC

## 2017-06-10 MED ORDER — TRANEXAMIC ACID 1000 MG/10ML IV SOLN
1000.0000 mg | Freq: Once | INTRAVENOUS | Status: AC
  Administered 2017-06-10: 1000 mg via INTRAVENOUS
  Filled 2017-06-10: qty 10

## 2017-06-10 MED ORDER — OXYCODONE HCL 5 MG PO TABS
5.0000 mg | ORAL_TABLET | ORAL | Status: DC | PRN
  Filled 2017-06-10: qty 1

## 2017-06-10 MED ORDER — CELECOXIB 100 MG PO CAPS
200.0000 mg | ORAL_CAPSULE | Freq: Two times a day (BID) | ORAL | Status: DC
  Administered 2017-06-10 – 2017-06-13 (×7): 200 mg via ORAL
  Filled 2017-06-10 (×3): qty 1
  Filled 2017-06-10: qty 2
  Filled 2017-06-10 (×2): qty 1
  Filled 2017-06-10: qty 2
  Filled 2017-06-10 (×2): qty 1

## 2017-06-10 MED ORDER — BUPIVACAINE HCL (PF) 0.5 % IJ SOLN
INTRAMUSCULAR | Status: DC | PRN
  Administered 2017-06-10: 3 mL

## 2017-06-10 MED ORDER — LIOTHYRONINE SODIUM 25 MCG PO TABS: 37.5000 ug | ORAL_TABLET | ORAL | Status: DC

## 2017-06-10 MED ORDER — SODIUM CHLORIDE 0.9 % IV SOLN
INTRAVENOUS | Status: DC
  Administered 2017-06-10 – 2017-06-13 (×5): via INTRAVENOUS

## 2017-06-10 MED ORDER — CHLORHEXIDINE GLUCONATE 4 % EX LIQD: 60.0000 mL | Freq: Once | CUTANEOUS | Status: DC

## 2017-06-10 MED ORDER — ENOXAPARIN SODIUM 30 MG/0.3ML ~~LOC~~ SOLN
30.0000 mg | SUBCUTANEOUS | Status: DC
  Administered 2017-06-11 – 2017-06-13 (×3): 30 mg via SUBCUTANEOUS
  Filled 2017-06-10 (×3): qty 0.3

## 2017-06-10 MED ORDER — SODIUM CHLORIDE 0.9 % IJ SOLN
INTRAMUSCULAR | Status: AC
  Filled 2017-06-10: qty 50

## 2017-06-10 MED ORDER — LOSARTAN POTASSIUM-HCTZ 50-12.5 MG PO TABS: 1.0000 | ORAL_TABLET | Freq: Every day | ORAL | Status: DC

## 2017-06-10 MED ORDER — ACETAMINOPHEN 10 MG/ML IV SOLN
INTRAVENOUS | Status: DC | PRN
  Administered 2017-06-10: 1000 mg via INTRAVENOUS

## 2017-06-10 MED ORDER — NEOMYCIN-POLYMYXIN B GU 40-200000 IR SOLN
Status: DC | PRN
Start: 2017-06-10 — End: 2017-06-10
  Administered 2017-06-10: 14 mL

## 2017-06-10 MED ORDER — CEFAZOLIN SODIUM-DEXTROSE 2-4 GM/100ML-% IV SOLN
INTRAVENOUS | Status: AC
  Filled 2017-06-10: qty 100

## 2017-06-10 MED ORDER — PHENOL 1.4 % MT LIQD
1.0000 | OROMUCOSAL | Status: DC | PRN
  Filled 2017-06-10: qty 177

## 2017-06-10 MED ORDER — LATANOPROST 0.005 % OP SOLN
1.0000 [drp] | Freq: Every day | OPHTHALMIC | Status: DC
  Administered 2017-06-10 – 2017-06-12 (×2): 1 [drp] via OPHTHALMIC
  Filled 2017-06-10: qty 2.5

## 2017-06-10 MED ORDER — POLYVINYL ALCOHOL 1.4 % OP SOLN
1.0000 [drp] | OPHTHALMIC | Status: DC | PRN
  Filled 2017-06-10: qty 15

## 2017-06-10 MED ORDER — DIPHENHYDRAMINE HCL 12.5 MG/5ML PO ELIX
12.5000 mg | ORAL_SOLUTION | ORAL | Status: DC | PRN
  Filled 2017-06-10: qty 10

## 2017-06-10 MED ORDER — FLEET ENEMA 7-19 GM/118ML RE ENEM: 1.0000 | ENEMA | Freq: Once | RECTAL | Status: DC | PRN

## 2017-06-10 MED ORDER — PROPOFOL 500 MG/50ML IV EMUL
INTRAVENOUS | Status: AC
  Filled 2017-06-10: qty 50

## 2017-06-10 MED ORDER — SODIUM CHLORIDE 0.9 % IV SOLN
INTRAVENOUS | Status: DC | PRN
  Administered 2017-06-10: 10 ug/min via INTRAVENOUS

## 2017-06-10 MED ORDER — TRAMADOL HCL 50 MG PO TABS
50.0000 mg | ORAL_TABLET | Freq: Two times a day (BID) | ORAL | Status: DC | PRN
  Administered 2017-06-10 – 2017-06-12 (×2): 50 mg via ORAL
  Filled 2017-06-10: qty 2
  Filled 2017-06-10 (×2): qty 1

## 2017-06-10 MED ORDER — VITAMIN D3 25 MCG (1000 UNIT) PO TABS
2000.0000 [IU] | ORAL_TABLET | Freq: Every day | ORAL | Status: DC
  Administered 2017-06-10 – 2017-06-13 (×4): 2000 [IU] via ORAL
  Filled 2017-06-10 (×7): qty 2

## 2017-06-10 MED ORDER — BUPIVACAINE HCL (PF) 0.25 % IJ SOLN
INTRAMUSCULAR | Status: DC | PRN
  Administered 2017-06-10: 60 mL

## 2017-06-10 MED ORDER — DEXAMETHASONE SODIUM PHOSPHATE 4 MG/ML IJ SOLN
INTRAMUSCULAR | Status: DC | PRN
  Administered 2017-06-10: 5 mg via INTRAVENOUS

## 2017-06-10 MED ORDER — FLUTICASONE PROPIONATE 50 MCG/ACT NA SUSP
1.0000 | Freq: Every day | NASAL | Status: DC
  Filled 2017-06-10: qty 16

## 2017-06-10 MED ORDER — LACTATED RINGERS IV SOLN
INTRAVENOUS | Status: DC
  Administered 2017-06-10: 1000 mL via INTRAVENOUS

## 2017-06-10 MED ORDER — PHENYLEPHRINE HCL 10 MG/ML IJ SOLN
INTRAMUSCULAR | Status: AC
  Filled 2017-06-10: qty 1

## 2017-06-10 MED ORDER — SENNOSIDES-DOCUSATE SODIUM 8.6-50 MG PO TABS
1.0000 | ORAL_TABLET | Freq: Two times a day (BID) | ORAL | Status: DC
  Administered 2017-06-10 – 2017-06-13 (×5): 1 via ORAL
  Filled 2017-06-10 (×6): qty 1

## 2017-06-10 MED ORDER — FAMOTIDINE 20 MG PO TABS
20.0000 mg | ORAL_TABLET | Freq: Once | ORAL | Status: AC
  Administered 2017-06-10: 20 mg via ORAL

## 2017-06-10 MED ORDER — FENTANYL CITRATE (PF) 100 MCG/2ML IJ SOLN
INTRAMUSCULAR | Status: DC | PRN
  Administered 2017-06-10: 25 ug via INTRAVENOUS

## 2017-06-10 MED ORDER — PROPOFOL 500 MG/50ML IV EMUL
INTRAVENOUS | Status: DC | PRN
  Administered 2017-06-10: 25 ug/kg/min via INTRAVENOUS

## 2017-06-10 MED ORDER — BUPIVACAINE LIPOSOME 1.3 % IJ SUSP
INTRAMUSCULAR | Status: AC
  Filled 2017-06-10: qty 20

## 2017-06-10 MED ORDER — LOSARTAN POTASSIUM 50 MG PO TABS
50.0000 mg | ORAL_TABLET | Freq: Every day | ORAL | Status: DC
  Administered 2017-06-10 – 2017-06-13 (×4): 50 mg via ORAL
  Filled 2017-06-10 (×4): qty 1

## 2017-06-10 MED ORDER — MORPHINE SULFATE (PF) 2 MG/ML IV SOLN: 2.0000 mg | INTRAVENOUS | Status: DC | PRN

## 2017-06-10 MED ORDER — POLYETHYL GLYCOL-PROPYL GLYCOL 0.4-0.3 % OP SOLN: 1.0000 [drp] | Freq: Every day | OPHTHALMIC | Status: DC | PRN

## 2017-06-10 MED ORDER — PANTOPRAZOLE SODIUM 40 MG PO TBEC
40.0000 mg | DELAYED_RELEASE_TABLET | Freq: Two times a day (BID) | ORAL | Status: DC
  Administered 2017-06-10 – 2017-06-13 (×6): 40 mg via ORAL
  Filled 2017-06-10 (×6): qty 1

## 2017-06-10 MED ORDER — HYDROCHLOROTHIAZIDE 12.5 MG PO CAPS
12.5000 mg | ORAL_CAPSULE | Freq: Every day | ORAL | Status: DC
  Administered 2017-06-10 – 2017-06-13 (×4): 12.5 mg via ORAL
  Filled 2017-06-10 (×4): qty 1

## 2017-06-10 MED ORDER — ONDANSETRON HCL 4 MG/2ML IJ SOLN: 4.0000 mg | Freq: Once | INTRAMUSCULAR | Status: DC | PRN

## 2017-06-10 MED ORDER — PROPOFOL 10 MG/ML IV BOLUS
INTRAVENOUS | Status: DC | PRN
  Administered 2017-06-10: 4.5 mg via INTRAVENOUS

## 2017-06-10 MED ORDER — FENTANYL CITRATE (PF) 100 MCG/2ML IJ SOLN
INTRAMUSCULAR | Status: AC
  Filled 2017-06-10: qty 2

## 2017-06-10 MED ORDER — FENTANYL CITRATE (PF) 100 MCG/2ML IJ SOLN: 25.0000 ug | INTRAMUSCULAR | Status: DC | PRN

## 2017-06-10 MED ORDER — BISACODYL 10 MG RE SUPP: 10.0000 mg | Freq: Every day | RECTAL | Status: DC | PRN

## 2017-06-10 MED ORDER — ACETAMINOPHEN 10 MG/ML IV SOLN
INTRAVENOUS | Status: AC
  Filled 2017-06-10: qty 100

## 2017-06-10 MED ORDER — ONDANSETRON HCL 4 MG/2ML IJ SOLN: 4.0000 mg | Freq: Four times a day (QID) | INTRAMUSCULAR | Status: DC | PRN

## 2017-06-10 MED ORDER — METOCLOPRAMIDE HCL 10 MG PO TABS
10.0000 mg | ORAL_TABLET | Freq: Three times a day (TID) | ORAL | Status: AC
  Administered 2017-06-10 – 2017-06-12 (×8): 10 mg via ORAL
  Filled 2017-06-10 (×8): qty 1

## 2017-06-10 MED ORDER — ACETAMINOPHEN 10 MG/ML IV SOLN
1000.0000 mg | Freq: Four times a day (QID) | INTRAVENOUS | Status: AC
  Administered 2017-06-10 – 2017-06-11 (×4): 1000 mg via INTRAVENOUS
  Filled 2017-06-10 (×4): qty 100

## 2017-06-10 MED ORDER — DEXAMETHASONE SODIUM PHOSPHATE 10 MG/ML IJ SOLN
INTRAMUSCULAR | Status: AC
  Filled 2017-06-10: qty 1

## 2017-06-10 MED ORDER — FAMOTIDINE 20 MG PO TABS
ORAL_TABLET | ORAL | Status: AC
  Administered 2017-06-10: 20 mg via ORAL
  Filled 2017-06-10: qty 1

## 2017-06-10 MED ORDER — CEFAZOLIN SODIUM-DEXTROSE 1-4 GM/50ML-% IV SOLN
1.0000 g | Freq: Two times a day (BID) | INTRAVENOUS | Status: AC
  Administered 2017-06-10 – 2017-06-11 (×2): 1 g via INTRAVENOUS
  Filled 2017-06-10 (×2): qty 50

## 2017-06-10 MED ORDER — ACETAMINOPHEN 325 MG PO TABS: 650.0000 mg | ORAL_TABLET | Freq: Four times a day (QID) | ORAL | Status: DC | PRN

## 2017-06-10 MED ORDER — LIDOCAINE HCL (PF) 2 % IJ SOLN
INTRAMUSCULAR | Status: AC
  Filled 2017-06-10: qty 2

## 2017-06-10 MED ORDER — MENTHOL 3 MG MT LOZG
1.0000 | LOZENGE | OROMUCOSAL | Status: DC | PRN
  Filled 2017-06-10: qty 9

## 2017-06-10 MED ORDER — ALUM & MAG HYDROXIDE-SIMETH 200-200-20 MG/5ML PO SUSP: 30.0000 mL | ORAL | Status: DC | PRN

## 2017-06-10 MED ORDER — BUPIVACAINE HCL (PF) 0.25 % IJ SOLN
INTRAMUSCULAR | Status: AC
  Filled 2017-06-10: qty 60

## 2017-06-10 MED ORDER — VERAPAMIL HCL ER 180 MG PO TBCR
180.0000 mg | EXTENDED_RELEASE_TABLET | Freq: Every day | ORAL | Status: DC
  Administered 2017-06-10 – 2017-06-11 (×2): 180 mg via ORAL
  Filled 2017-06-10 (×3): qty 1

## 2017-06-10 MED ORDER — LIOTHYRONINE SODIUM 25 MCG PO TABS
25.0000 ug | ORAL_TABLET | ORAL | Status: DC
  Administered 2017-06-11 – 2017-06-13 (×3): 25 ug via ORAL
  Filled 2017-06-10 (×4): qty 1

## 2017-06-10 MED ORDER — FLUOCINONIDE 0.05 % EX OINT
1.0000 "application " | TOPICAL_OINTMENT | Freq: Every day | CUTANEOUS | Status: DC | PRN
  Filled 2017-06-10: qty 15

## 2017-06-10 MED ORDER — NEOMYCIN-POLYMYXIN B GU 40-200000 IR SOLN
Status: AC
  Filled 2017-06-10: qty 20

## 2017-06-10 MED ORDER — ONDANSETRON HCL 4 MG PO TABS: 4.0000 mg | ORAL_TABLET | Freq: Four times a day (QID) | ORAL | Status: DC | PRN

## 2017-06-10 MED ORDER — MIDAZOLAM HCL 5 MG/5ML IJ SOLN
INTRAMUSCULAR | Status: DC | PRN
  Administered 2017-06-10: 0.5 mg via INTRAVENOUS

## 2017-06-10 MED ORDER — SODIUM CHLORIDE 0.9 % IV SOLN
INTRAVENOUS | Status: DC | PRN
  Administered 2017-06-10: 60 mL

## 2017-06-10 MED ORDER — FERROUS SULFATE 325 (65 FE) MG PO TABS
325.0000 mg | ORAL_TABLET | Freq: Two times a day (BID) | ORAL | Status: DC
  Administered 2017-06-10 – 2017-06-13 (×6): 325 mg via ORAL
  Filled 2017-06-10 (×6): qty 1

## 2017-06-10 SURGICAL SUPPLY — 62 items
BATTERY INSTRU NAVIGATION (MISCELLANEOUS) ×12 IMPLANT
BLADE SAW 1 (BLADE) ×3 IMPLANT
BLADE SAW 1/2 (BLADE) ×3 IMPLANT
BLADE SAW 70X12.5 (BLADE) IMPLANT
CANISTER SUCT 1200ML W/VALVE (MISCELLANEOUS) ×3 IMPLANT
CANISTER SUCT 3000ML PPV (MISCELLANEOUS) ×6 IMPLANT
CAPT KNEE TOTAL 3 ATTUNE ×3 IMPLANT
CATH TRAY METER 16FR LF (MISCELLANEOUS) ×3 IMPLANT
CEMENT HV SMART SET (Cement) ×6 IMPLANT
COOLER POLAR GLACIER W/PUMP (MISCELLANEOUS) ×3 IMPLANT
CUFF TOURN 24 STER (MISCELLANEOUS) ×3 IMPLANT
CUFF TOURN 30 STER DUAL PORT (MISCELLANEOUS) IMPLANT
DRAPE SHEET LG 3/4 BI-LAMINATE (DRAPES) ×3 IMPLANT
DRSG DERMACEA 8X12 NADH (GAUZE/BANDAGES/DRESSINGS) ×3 IMPLANT
DRSG OPSITE POSTOP 4X14 (GAUZE/BANDAGES/DRESSINGS) ×3 IMPLANT
DRSG TEGADERM 4X4.75 (GAUZE/BANDAGES/DRESSINGS) ×3 IMPLANT
DURAPREP 26ML APPLICATOR (WOUND CARE) ×6 IMPLANT
ELECT CAUTERY BLADE 6.4 (BLADE) ×3 IMPLANT
ELECT REM PT RETURN 9FT ADLT (ELECTROSURGICAL) ×3
ELECTRODE REM PT RTRN 9FT ADLT (ELECTROSURGICAL) ×1 IMPLANT
EVACUATOR 1/8 PVC DRAIN (DRAIN) ×3 IMPLANT
EX-PIN ORTHOLOCK NAV 4X150 (PIN) ×6 IMPLANT
GLOVE BIOGEL M STRL SZ7.5 (GLOVE) ×6 IMPLANT
GLOVE BIOGEL PI IND STRL 9 (GLOVE) ×2 IMPLANT
GLOVE BIOGEL PI INDICATOR 9 (GLOVE) ×4
GLOVE INDICATOR 8.0 STRL GRN (GLOVE) ×3 IMPLANT
GLOVE SURG SYN 9.0  PF PI (GLOVE) ×4
GLOVE SURG SYN 9.0 PF PI (GLOVE) ×2 IMPLANT
GOWN STRL REUS W/ TWL LRG LVL3 (GOWN DISPOSABLE) ×2 IMPLANT
GOWN STRL REUS W/TWL 2XL LVL3 (GOWN DISPOSABLE) ×3 IMPLANT
GOWN STRL REUS W/TWL LRG LVL3 (GOWN DISPOSABLE) ×4
HOLDER FOLEY CATH W/STRAP (MISCELLANEOUS) ×3 IMPLANT
HOOD PEEL AWAY FLYTE STAYCOOL (MISCELLANEOUS) ×6 IMPLANT
KIT RM TURNOVER STRD PROC AR (KITS) ×3 IMPLANT
KNIFE SCULPS 14X20 (INSTRUMENTS) ×3 IMPLANT
LABEL OR SOLS (LABEL) ×3 IMPLANT
NDL SAFETY 18GX1.5 (NEEDLE) ×3 IMPLANT
NEEDLE SPNL 20GX3.5 QUINCKE YW (NEEDLE) ×6 IMPLANT
NS IRRIG 500ML POUR BTL (IV SOLUTION) ×3 IMPLANT
PACK TOTAL KNEE (MISCELLANEOUS) ×3 IMPLANT
PAD WRAPON POLAR KNEE (MISCELLANEOUS) ×1 IMPLANT
PIN DRILL QUICK PACK ×3 IMPLANT
PIN FIXATION 1/8DIA X 3INL (PIN) ×3 IMPLANT
PULSAVAC PLUS IRRIG FAN TIP (DISPOSABLE) ×3
SOL .9 NS 3000ML IRR  AL (IV SOLUTION) ×2
SOL .9 NS 3000ML IRR UROMATIC (IV SOLUTION) ×1 IMPLANT
SOL PREP PVP 2OZ (MISCELLANEOUS) ×3
SOLUTION PREP PVP 2OZ (MISCELLANEOUS) ×1 IMPLANT
SPONGE DRAIN TRACH 4X4 STRL 2S (GAUZE/BANDAGES/DRESSINGS) ×3 IMPLANT
STAPLER SKIN PROX 35W (STAPLE) ×3 IMPLANT
STRAP TIBIA SHORT (MISCELLANEOUS) ×3 IMPLANT
SUCTION FRAZIER HANDLE 10FR (MISCELLANEOUS) ×2
SUCTION TUBE FRAZIER 10FR DISP (MISCELLANEOUS) ×1 IMPLANT
SUT VIC AB 0 CT1 36 (SUTURE) ×3 IMPLANT
SUT VIC AB 1 CT1 36 (SUTURE) ×6 IMPLANT
SUT VIC AB 2-0 CT2 27 (SUTURE) ×3 IMPLANT
SYR 20CC LL (SYRINGE) ×3 IMPLANT
SYR 30ML LL (SYRINGE) ×6 IMPLANT
TIP FAN IRRIG PULSAVAC PLUS (DISPOSABLE) ×1 IMPLANT
TOWEL OR 17X26 4PK STRL BLUE (TOWEL DISPOSABLE) ×3 IMPLANT
TOWER CARTRIDGE SMART MIX (DISPOSABLE) ×3 IMPLANT
WRAPON POLAR PAD KNEE (MISCELLANEOUS) ×3

## 2017-06-10 NOTE — Evaluation (Signed)
Physical Therapy Evaluation Patient Details Name: Heather Colon MRN: [id number] DOB: 1933-03-31 Today's Date: 06/10/2017   History of Present Illness  Pt underwent L TKR without reported post-op complications. She is POD#0 at time of PT evaluation. PMH includes R TKR last year.   Clinical Impression  Pt admitted with above diagnosis. Pt currently with functional limitations due to the deficits listed below (see PT Problem List).  Pt demonstrates excellent strength today with bed exercises and mobility. She requires supervision only for bed mobility and CGA for sit to stand transfers. She is able to ambulate from bed to recliner with rolling walker. Good stability without need for external assist. Education and cues for sequencing with walker. No DOE with ambulation. Pt denies lightheadedness or dizziness with position changes and walker. AAROM is excellent at -1 to 94 degrees and primarily limited by pain with flexion at available end range. Pt will be appropriate to return home with [facility] PT at discharge and support from family. Pt will benefit from PT services to address deficits in strength, balance, and mobility in order to return to full function at home.      Follow Up Recommendations Home health PT    Equipment Recommendations  None recommended by PT;Other (comment) (Pt has a rolling walker she will use at discharge)    Recommendations for Other Services OT consult     Precautions / Restrictions Precautions Precautions: Knee Precaution Booklet Issued: Yes (comment) Required Braces or Orthoses: Knee Immobilizer - Left Knee Immobilizer - Left: Discontinue once straight leg raise with < 10 degree lag Restrictions Weight Bearing Restrictions: Yes LLE Weight Bearing: Weight bearing as tolerated      Mobility  Bed Mobility Overal bed mobility: Needs Assistance Bed Mobility: Supine to Sit     Supine to sit: Supervision     General bed mobility comments: Pt requires mild increase  in time. HOB elevated and bed rails utilized. No external assist required  Transfers Overall transfer level: Needs assistance Equipment used: Rolling walker (2 wheeled) Transfers: Sit to/from Stand Sit to Stand: Min guard         General transfer comment: Pt demonstrates fair weight shift to LLE and good LE strength. Cues for safe hand placement. Pt is steady and stable once upright in standing  Ambulation/Gait Ambulation/Gait assistance: Min guard Ambulation Distance (Feet): 5 Feet Assistive device: Rolling walker (2 wheeled) Gait Pattern/deviations: Step-to pattern;Decreased weight shift to left;Decreased stance time - left Gait velocity: Decreased Gait velocity interpretation: <1.8 ft/sec, indicative of risk for recurrent falls General Gait Details: Pt able to ambulate from bed to recliner with rolling walker. Good stability without need for external assist. Education and cues for sequencing with walker. No DOE with ambulation. Pt denies lightheadedness or dizziness with position changes and walker  Stairs            Wheelchair Mobility    Modified Rankin (Stroke Patients Only)       Balance Overall balance assessment: Needs assistance Sitting-balance support: No upper extremity supported Sitting balance-Leahy Scale: Good     Standing balance support: Bilateral upper extremity supported Standing balance-Leahy Scale: Fair                               Pertinent Vitals/Pain Pain Assessment: 0-10 Pain Score: 3  Pain Location: L knee Pain Descriptors / Indicators: Operative site guarding Pain Intervention(s): Monitored during session;Premedicated before session    Home Living Family/patient  expects to be discharged to:: Private residence Living Arrangements: Alone Available Help at Discharge: Family (Sister will stay with her after DC, grandson next door) Type of Home: House Home Access: Stairs to enter Entrance Stairs-Rails: Right;Left;Can reach  both Entrance Stairs-Number of Steps: 3 Home Layout: One [place] - single point;Walker - 2 wheels;Shower seat;Bedside commode (no grab bars)      Prior Function Level of Independence: Independent         Comments: Independent with ADL/IADLs. Occasional assist from family with shopping. Drives. No falls in the last 12 months     Hand Dominance   Dominant Hand: Right    Extremity/Trunk Assessment   Upper Extremity Assessment Upper Extremity Assessment: Overall WFL for tasks assessed    Lower Extremity Assessment Lower Extremity Assessment: LLE deficits/detail LLE Deficits / Details: Full sensation to light touch. Full DF/PF. Able to perform SLR and SAQ without assistance       Communication   Communication: No difficulties  Cognition Arousal/Alertness: Awake/alert Behavior During Therapy: WFL for tasks assessed/performed Overall Cognitive Status: Within Functional Limits for tasks assessed                                        General Comments      Exercises Total Joint Exercises Ankle Circles/Pumps: AROM;Both;10 reps;Supine Quad Sets: Strengthening;Both;10 reps;Supine Gluteal Sets: Strengthening;Both;10 reps;Supine Towel Squeeze: Strengthening;Both;10 reps;Supine Short Arc Quad: Strengthening;Left;10 reps;Supine Heel Slides: Strengthening;Left;10 reps;Supine Hip ABduction/ADduction: Strengthening;Left;10 reps;Supine Straight Leg Raises: Strengthening;Left;10 reps;Supine Goniometric ROM: -1 to 94 degrees AAROM, pain limited   Assessment/Plan    PT Assessment Patient needs continued PT services  PT Problem List Decreased strength;Decreased range of motion;Decreased activity tolerance;Decreased balance;Decreased mobility;Pain       PT Treatment Interventions DME instruction;Gait training;Stair training;Therapeutic exercise;Balance training;Therapeutic activities;Neuromuscular re-education;Patient/family education;Manual  techniques    PT Goals (Current goals can be found in the Care Plan section)  Acute Rehab PT Goals Patient Stated Goal: Return to prior function at home PT Goal Formulation: With patient Time For Goal Achievement: 06/24/17 Potential to Achieve Goals: Good    Frequency BID   Barriers to discharge Decreased caregiver support Lives alone but will have family to stay with her at discharge    Co-evaluation               AM-PAC PT "6 Clicks" Daily Activity  Outcome Measure Difficulty turning over in bed (including adjusting bedclothes, sheets and blankets)?: A Little Difficulty moving from lying on back to sitting on the side of the bed? : A Little Difficulty sitting down on and standing up from a chair with arms (e.g., wheelchair, bedside commode, etc,.)?: A Little Help needed moving to and from a bed to chair (including a wheelchair)?: A Little Help needed walking in hospital room?: A Little Help needed climbing 3-5 steps with a railing? : A Lot 6 Click Score: 17    End of Session Equipment Utilized During Treatment: Gait belt Activity Tolerance: Patient tolerated treatment well Patient left: in chair;with call bell/phone within reach;with chair alarm set Nurse Communication: Mobility status PT Visit Diagnosis: Muscle weakness (generalized) (M62.81);Difficulty in walking, not elsewhere classified (R26.2);Pain Pain - Right/Left: Left Pain - part of body: Knee    Time: 1540-1609 PT Time Calculation (min) (ACUTE ONLY): 29 min   Charges:   PT Evaluation $PT Eval Low Complexity: 1 Low PT Treatments $Therapeutic Exercise: 8-22  mins   PT G Codes:   PT G-Codes **NOT FOR INPATIENT CLASS** Functional Assessment Tool Used: AM-PAC 6 Clicks Basic Mobility Functional Limitation: Mobility: Walking and moving around Mobility: Walking and Moving Around Current Status (L4650): At least 40 percent but less than 60 percent impaired, limited or restricted Mobility: Walking and Moving  Around Goal Status [phone]): At least 1 percent but less than 20 percent impaired, limited or restricted    Phillips Grout PT, DPT    Huprich,Jason 06/10/2017, 5:21 PM

## 2017-06-10 NOTE — H&P (Signed)
The patient has been re-examined, and the chart reviewed, and there have been no interval changes to the documented history and physical.    The risks, benefits, and alternatives have been discussed at length. The patient expressed understanding of the risks benefits and agreed with plans for surgical intervention.  James P. Hooten, Jr. M.D.    

## 2017-06-10 NOTE — Transfer of Care (Signed)
Immediate Anesthesia Transfer of Care Note  Patient: Heather Colon  Procedure(s) Performed: Procedure(s): COMPUTER ASSISTED TOTAL KNEE ARTHROPLASTY (Left)  Patient Location: PACU  Anesthesia Type:Spinal  Level of Consciousness: awake, alert , oriented and patient cooperative  Airway & Oxygen Therapy: Patient Spontanous Breathing and Patient connected to nasal cannula oxygen  Post-op Assessment: Report given to RN and Post -op Vital signs reviewed and stable  Post vital signs: Reviewed and stable  Last Vitals:  Vitals:   06/10/17 0608  BP: (!) 161/42  Pulse: 68  Resp: 14  Temp: 36.5 C  SpO2: 98%    Last Pain:  Vitals:   06/10/17 0608  TempSrc: Tympanic         Complications: No apparent anesthesia complications

## 2017-06-10 NOTE — Anesthesia Procedure Notes (Signed)
Spinal  Patient location during procedure: OR Start time: 06/10/2017 7:25 AM End time: 06/10/2017 7:38 AM Staffing Performed: anesthesiologist  Preanesthetic Checklist Completed: patient identified, site marked, surgical consent, pre-op evaluation, timeout performed, IV checked, risks and benefits discussed and monitors and equipment checked Spinal Block Patient position: sitting Prep: Betadine Patient monitoring: heart rate, continuous pulse ox, blood pressure and cardiac monitor Approach: midline Location: L4-5 Injection technique: single-shot Needle Needle type: Introducer and Pencan  Needle gauge: 24 G Needle length: 9 cm Additional Notes Negative paresthesia. Negative blood return. Positive free-flowing CSF. Expiration date of kit checked and confirmed. Patient tolerated procedure well, without complications.

## 2017-06-10 NOTE — Op Note (Signed)
OPERATIVE NOTE  DATE OF SURGERY:  06/10/2017  PATIENT NAME:  Heather Colon   DOB: Oct 18, 1933  MRN: [id number]  PRE-OPERATIVE DIAGNOSIS: Degenerative arthrosis of the left knee, primary  POST-OPERATIVE DIAGNOSIS:  Same  PROCEDURE:  Left total knee arthroplasty using computer-assisted navigation  SURGEON:  Marciano Sequin. M.D.  ASSISTANT:  Vance Peper, PA (present and scrubbed throughout the case, critical for assistance with exposure, retraction, instrumentation, and closure)  ANESTHESIA: spinal  ESTIMATED BLOOD LOSS: 50 mL  FLUIDS REPLACED: 950 mL of crystalloid  TOURNIQUET TIME: 70 minutes  DRAINS: 2 medium Hemovac drains  SOFT TISSUE RELEASES: Anterior cruciate ligament, posterior cruciate ligament, deep and superficial medial collateral ligament, patellofemoral ligament  IMPLANTS UTILIZED: DePuy Attune size 6N posterior stabilized femoral component (cemented), size 4 rotating platform tibial component (cemented), 35 mm medialized dome patella (cemented), and a 6 mm stabilized rotating platform polyethylene insert.  INDICATIONS FOR SURGERY: Heather Colon is a 83 y.o. year old female with a long history of progressive knee pain. X-rays demonstrated severe degenerative changes in tricompartmental fashion. The patient had not seen any significant improvement despite conservative nonsurgical intervention. After discussion of the risks and benefits of surgical intervention, the patient expressed understanding of the risks benefits and agree with plans for total knee arthroplasty.   The risks, benefits, and alternatives were discussed at length including but not limited to the risks of infection, bleeding, nerve injury, stiffness, blood clots, the need for revision surgery, cardiopulmonary complications, among others, and they were willing to proceed.  PROCEDURE IN DETAIL: The patient was brought into the operating room and, after adequate spinal anesthesia was achieved, a tourniquet  was placed on the patient's upper thigh. The patient's knee and leg were cleaned and prepped with alcohol and DuraPrep and draped in the usual sterile fashion. A "timeout" was performed as per usual protocol. The lower extremity was exsanguinated using an Esmarch, and the tourniquet was inflated to 300 mmHg. An anterior longitudinal incision was made followed by a standard mid vastus approach. The deep fibers of the medial collateral ligament were elevated in a subperiosteal fashion off of the medial flare of the tibia so as to maintain a continuous soft tissue sleeve. The patella was subluxed laterally and the patellofemoral ligament was incised. Inspection of the knee demonstrated severe degenerative changes with full-thickness loss of articular cartilage. Osteophytes were debrided using a rongeur. Anterior and posterior cruciate ligaments were excised. Two 4.0 mm Schanz pins were inserted in the femur and into the tibia for attachment of the array of trackers used for computer-assisted navigation. Hip center was identified using a circumduction technique. Distal landmarks were mapped using the computer. The distal femur and proximal tibia were mapped using the computer. The distal femoral cutting guide was positioned using computer-assisted navigation so as to achieve a 5 distal valgus cut. The femur was sized and it was felt that a size 6N femoral component was appropriate. A size 6 femoral cutting guide was positioned and the anterior cut was performed and verified using the computer. This was followed by completion of the posterior and chamfer cuts. Femoral cutting guide for the central box was then positioned in the center box cut was performed.  Attention was then directed to the proximal tibia. Medial and lateral menisci were excised. The extramedullary tibial cutting guide was positioned using computer-assisted navigation so as to achieve a 0 varus-valgus alignment and 3 posterior slope. The cut was  performed and verified using the computer. The proximal tibia  was sized and it was felt that a size 4 tibial tray was appropriate. Tibial and femoral trials were inserted followed by insertion of a 5 mm polyethylene insert. The knee was felt to be tight medially. A Cobb elevator was used to elevate the superficial fibers of the medial collateral ligament.  The 5 mm insert was replaced by a 6 mm insert. This allowed for excellent mediolateral soft tissue balancing both in flexion and in full extension. Finally, the patella was cut and prepared so as to accommodate a 35 mm medialized dome patella. A patella trial was placed and the knee was placed through a range of motion with excellent patellar tracking appreciated. The femoral trial was removed after debridement of posterior osteophytes. The central post-hole for the tibial component was reamed followed by insertion of a keel punch. Tibial trials were then removed. Cut surfaces of bone were irrigated with copious amounts of normal saline with antibiotic solution using pulsatile lavage and then suctioned dry. Polymethylmethacrylate cement was prepared in the usual fashion using a vacuum mixer. Cement was applied to the cut surface of the proximal tibia as well as along the undersurface of a size 4 rotating platform tibial component. Tibial component was positioned and impacted into place. Excess cement was removed using [employer] fast streamer. Cement was then applied to the cut surfaces of the femur as well as along the posterior flanges of the size 6N femoral component. The femoral component was positioned and impacted into place. Excess cement was removed using [employer] fast streamer. A 6 mm polyethylene trial was inserted and the knee was brought into full extension with steady axial compression applied. Finally, cement was applied to the backside of a 35 mm medialized dome patella and the patellar component was positioned and patellar clamp applied. Excess cement was removed  using [employer] fast streamer. After adequate curing of the cement, the tourniquet was deflated after a total tourniquet time of 70 minutes. Hemostasis was achieved using electrocautery. The knee was irrigated with copious amounts of normal saline with antibiotic solution using pulsatile lavage and then suctioned dry. 20 mL of 1.3% Exparel and 60 mL of 0.25% Marcaine in 40 mL of normal saline was injected along the posterior capsule, medial and lateral gutters, and along the arthrotomy site. A 6 mm stabilized rotating platform polyethylene insert was inserted and the knee was placed through a range of motion with excellent mediolateral soft tissue balancing appreciated and excellent patellar tracking noted. 2 medium drains were placed in the wound bed and brought out through separate stab incisions. The medial parapatellar portion of the incision was reapproximated using interrupted sutures of #1 Vicryl. Subcutaneous tissue was approximated in layers using first #0 Vicryl followed #2-0 Vicryl. The skin was approximated with skin staples. A sterile dressing was applied.  The patient tolerated the procedure well and was transported to the recovery room in stable condition.    Pravin Perezperez P. Holley Bouche., M.D.

## 2017-06-10 NOTE — Progress Notes (Signed)
lovenox order changed from 30mg  BID to 30 mg daily due to crcl <53ml/min per protocol  Ramond Dial, Pharm.D, BCPS Clinical Pharmacist

## 2017-06-10 NOTE — Progress Notes (Deleted)
Report called in to [place] at [facility].  Bethann Punches, RN

## 2017-06-10 NOTE — Progress Notes (Signed)
PHARMACY NOTE:  ANTIMICROBIAL RENAL DOSAGE ADJUSTMENT  Current antimicrobial regimen includes a mismatch between antimicrobial dosage and estimated renal function.  As per policy approved by the Pharmacy & Therapeutics and Medical Executive Committees, the antimicrobial dosage will be adjusted accordingly.  Current antimicrobial dosage:  Cefazolin 2g q 6 hr x 24hr  Indication: surgical prophylaxis  Renal Function:  Estimated Creatinine Clearance: 27.3 mL/min (A) (by C-G formula based on SCr of 1.15 mg/dL (H)). []      On intermittent HD, scheduled: []      On CRRT    Antimicrobial dosage has been changed to:  Cefazolin 1 g q 12hr x 24hr  Additional comments:   Thank you for allowing pharmacy to be a part of this patient's care.  Ramond Dial, Pharm.D, BCPS Clinical Pharmacist  06/10/2017 12:11 PM

## 2017-06-10 NOTE — NC FL2 (Signed)
[place] LEVEL OF CARE SCREENING TOOL     IDENTIFICATION  Patient Name: Heather Colon Birthdate: 1933-08-24 Sex: female Admission Date (Current Location): 06/10/2017  [place] and [place] Number:  Engineering geologist and Address:  [facility], [address], [place], [place] [address]      Provider Number: [id number]  Attending Physician Name and Address:  Dereck Leep, MD  Relative Name and Phone Number:       Current Level of Care: Hospital Recommended Level of Care: [place] Prior Approval Number:    Date Approved/Denied:   PASRR Number:  [id number])  Discharge Plan: SNF    Current Diagnoses: Patient Active Problem List   Diagnosis Date Noted  . S/P total knee arthroplasty 06/18/2016    Orientation RESPIRATION BLADDER Height & Weight     Self, Time, Situation, Place  Normal Continent Weight: 101 lb (45.8 kg) Height:  5\' 1"  (154.9 cm)  BEHAVIORAL SYMPTOMS/MOOD NEUROLOGICAL BOWEL NUTRITION STATUS   (none)  (none) Continent Diet (Regular Diet )  AMBULATORY STATUS COMMUNICATION OF NEEDS Skin   Extensive Assist Verbally Surgical wounds (Incision: Right Knee )                       Personal Care Assistance Level of Assistance  Bathing, Feeding, Dressing Bathing Assistance: Limited assistance Feeding assistance: Independent Dressing Assistance: Limited assistance     Functional Limitations Info  Sight, Hearing, Speech Sight Info: Adequate Hearing Info: Impaired Speech Info: Adequate    SPECIAL CARE FACTORS FREQUENCY  PT (By licensed PT), OT (By licensed OT)     PT Frequency:  (5) OT Frequency:  (5)            Contractures      Additional Factors Info  Code Status, Allergies, Isolation Precautions Code Status Info:  (Full Code. ) Allergies Info:  (Actonel Risedronate Sodium, Codeine, Other)     Isolation Precautions Info:  (MRSA Nasal Swab. )     Current Medications  (06/10/2017):  This is the current hospital active medication list Current Facility-Administered Medications  Medication Dose Route Frequency Provider Last Rate Last Dose  . 0.9 %  sodium chloride infusion   Intravenous Continuous Hooten, Laurice Record, MD 100 mL/hr at 06/10/17 1323    . acetaminophen (OFIRMEV) IV 1,000 mg  1,000 mg Intravenous Q6H Hooten, Laurice Record, MD   Stopped at 06/10/17 1339  . acetaminophen (TYLENOL) tablet 650 mg  650 mg Oral Q6H PRN Hooten, Laurice Record, MD       Or  . acetaminophen (TYLENOL) suppository 650 mg  650 mg Rectal Q6H PRN Hooten, Laurice Record, MD      . alum & mag hydroxide-simeth (MAALOX/MYLANTA) 200-200-20 MG/5ML suspension 30 mL  30 mL Oral Q4H PRN Hooten, Laurice Record, MD      . bisacodyl (DULCOLAX) suppository 10 mg  10 mg Rectal Daily PRN Hooten, Laurice Record, MD      . ceFAZolin (ANCEF) IVPB 1 g/50 mL premix  1 g Intravenous Q12H Hooten, Laurice Record, MD      . celecoxib (CELEBREX) capsule 200 mg  200 mg Oral Q12H Hooten, Laurice Record, MD   200 mg at 06/10/17 1324  . cholecalciferol (VITAMIN D) tablet 2,000 Units  2,000 Units Oral Q lunch Hooten, Laurice Record, MD   2,000 Units at 06/10/17 1324  . diphenhydrAMINE (BENADRYL) 12.5 MG/5ML elixir 12.5-25 mg  12.5-25 mg Oral Q4H PRN Hooten, Laurice Record, MD      . [  START ON 06/11/2017] enoxaparin (LOVENOX) injection 30 mg  30 mg Subcutaneous Q24H Hooten, Laurice Record, MD      . ferrous sulfate tablet 325 mg  325 mg Oral BID WC Hooten, Laurice Record, MD      . fluocinonide ointment (LIDEX) 9.35 % 1 application  1 application Topical Daily PRN Hooten, Laurice Record, MD      . fluticasone (FLONASE) 50 MCG/ACT nasal spray 1 spray  1 spray Each Nare Daily Hooten, Laurice Record, MD      . losartan (COZAAR) tablet 50 mg  50 mg Oral Daily Hooten, Laurice Record, MD   50 mg at 06/10/17 1324   And  . hydrochlorothiazide (MICROZIDE) capsule 12.5 mg  12.5 mg Oral Daily Hooten, Laurice Record, MD   12.5 mg at 06/10/17 1324  . latanoprost (XALATAN) 0.005 % ophthalmic solution 1 drop  1 drop Both Eyes QHS  Hooten, Laurice Record, MD      . liothyronine (CYTOMEL) tablet 25 mcg  25 mcg Oral Once per day on Sun Tue Wed Thu Sat Dereck Leep, MD      . Derrill Memo ON 06/14/2017] liothyronine (CYTOMEL) tablet 37.5 mcg  37.5 mcg Oral Once per day on Mon Fri Hooten, Laurice Record, MD      . magnesium hydroxide (MILK OF MAGNESIA) suspension 30 mL  30 mL Oral Daily PRN Hooten, Laurice Record, MD      . menthol-cetylpyridinium (CEPACOL) lozenge 3 mg  1 lozenge Oral PRN Hooten, Laurice Record, MD       Or  . phenol (CHLORASEPTIC) mouth spray 1 spray  1 spray Mouth/Throat PRN Hooten, Laurice Record, MD      . metoCLOPramide (REGLAN) tablet 10 mg  10 mg Oral TID AC & HS Hooten, Laurice Record, MD   10 mg at 06/10/17 1324  . morphine 2 MG/ML injection 2 mg  2 mg Intravenous Q2H PRN Hooten, Laurice Record, MD      . ondansetron (ZOFRAN) tablet 4 mg  4 mg Oral Q6H PRN Hooten, Laurice Record, MD       Or  . ondansetron (ZOFRAN) injection 4 mg  4 mg Intravenous Q6H PRN Hooten, Laurice Record, MD      . oxyCODONE (Oxy IR/ROXICODONE) immediate release tablet 5-10 mg  5-10 mg Oral Q4H PRN Hooten, Laurice Record, MD      . pantoprazole (PROTONIX) EC tablet 40 mg  40 mg Oral BID Hooten, Laurice Record, MD      . polyvinyl alcohol (LIQUIFILM TEARS) 1.4 % ophthalmic solution 1 drop  1 drop Both Eyes PRN Hooten, Laurice Record, MD      . senna-docusate (Senokot-S) tablet 1 tablet  1 tablet Oral BID Hooten, Laurice Record, MD      . sodium phosphate (FLEET) 7-19 GM/118ML enema 1 enema  1 enema Rectal Once PRN Hooten, Laurice Record, MD      . traMADol Veatrice Bourbon) tablet 50-100 mg  50-100 mg Oral Q12H PRN Dereck Leep, MD   50 mg at 06/10/17 1324  . verapamil (CALAN-SR) CR tablet 180 mg  180 mg Oral QHS Hooten, Laurice Record, MD         Discharge Medications: Please see discharge summary for a list of discharge medications.  Relevant Imaging Results:  Relevant Lab Results:   Additional Information  (SSN: [id number])  Sample, Veronia Beets, LCSW

## 2017-06-10 NOTE — Progress Notes (Signed)
Called pharmacy for txa.

## 2017-06-10 NOTE — Anesthesia Preprocedure Evaluation (Signed)
Anesthesia Evaluation  Patient identified by MRN, date of birth, ID band Patient awake    Reviewed: Allergy & Precautions, NPO status , Patient's Chart, lab work & pertinent test results  History of Anesthesia Complications Negative for: history of anesthetic complications  Airway Mallampati: II       Dental   Pulmonary neg sleep apnea, neg COPD,           Cardiovascular hypertension, Pt. on medications + CAD  + dysrhythmias Atrial Fibrillation      Neuro/Psych neg Seizures    GI/Hepatic Neg liver ROS, GERD  Controlled,  Endo/Other  neg diabetesHypothyroidism   Renal/GU Renal InsufficiencyRenal disease     Musculoskeletal   Abdominal   Peds  Hematology   Anesthesia Other Findings   Reproductive/Obstetrics                             Anesthesia Physical Anesthesia Plan  ASA: III  Anesthesia Plan: Spinal   Post-op Pain Management:    Induction:   PONV Risk Score and Plan:   Airway Management Planned:   Additional Equipment:   Intra-op Plan:   Post-operative Plan:   Informed Consent: I have reviewed the patients History and Physical, chart, labs and discussed the procedure including the risks, benefits and alternatives for the proposed anesthesia with the patient or authorized representative who has indicated his/her understanding and acceptance.     Plan Discussed with:   Anesthesia Plan Comments:         Anesthesia Quick Evaluation

## 2017-06-10 NOTE — Anesthesia Post-op Follow-up Note (Signed)
Anesthesia QCDR form completed.        

## 2017-06-11 LAB — CBC
HEMATOCRIT: 32.4 % — AB (ref 35.0–47.0)
Hemoglobin: 11.2 g/dL — ABNORMAL LOW (ref 12.0–16.0)
MCH: 31.9 pg (ref 26.0–34.0)
MCHC: 34.7 g/dL (ref 32.0–36.0)
MCV: 91.9 fL (ref 80.0–100.0)
PLATELETS: 177 10*3/uL (ref 150–440)
RBC: 3.52 MIL/uL — ABNORMAL LOW (ref 3.80–5.20)
RDW: 13.5 % (ref 11.5–14.5)
WBC: 11.7 10*3/uL — AB (ref 3.6–11.0)

## 2017-06-11 LAB — BASIC METABOLIC PANEL
ANION GAP: 7 (ref 5–15)
BUN: 29 mg/dL — ABNORMAL HIGH (ref 6–20)
CALCIUM: 8.7 mg/dL — AB (ref 8.9–10.3)
CO2: 23 mmol/L (ref 22–32)
CREATININE: 1.07 mg/dL — AB (ref 0.44–1.00)
Chloride: 105 mmol/L (ref 101–111)
GFR, EST AFRICAN AMERICAN: 54 mL/min — AB (ref >60)
GFR, EST NON AFRICAN AMERICAN: 47 mL/min — AB (ref >60)
Glucose, Bld: 117 mg/dL — ABNORMAL HIGH (ref 65–99)
Potassium: 4 mmol/L (ref 3.5–5.1)
SODIUM: 135 mmol/L (ref 135–145)

## 2017-06-11 NOTE — Evaluation (Signed)
Occupational Therapy Evaluation Patient Details Name: Heather Colon MRN: [id number] DOB: 02/28/1933 Today's Date: 06/11/2017    History of Present Illness Pt underwent L TKR without reported post-op complications. She is POD#0 at time of PT evaluation. PMH includes R TKR last year.    Clinical Impression   Pt is 83 year old female s/p L TKR.  Pt was living at home alone in a one story home and was independent in all ADLs prior to surgery.  She is eager to return to PLOF and will be having her sister stay with her for a month to help her. After that she has a son in the area and a neighbor who can help her. Pt currently requires supervision and min cues  for LB dressing while seated EOB due to limited AROM of L knee.  Pt would benefit from instruction in dressing techniques with or without assistive devices for dressing and bathing skills.  Pt would also benefit from recommendations for home modifications to increase safety in the bathroom and prevent falls. No further OT recommended after discharge.      Follow Up Recommendations  No OT follow up    Equipment Recommendations  Other (comment) (rec replacing towel on floor with a non slip bath mat outside of tub)    Recommendations for Other Services       Precautions / Restrictions Precautions Precautions: Knee Knee Immobilizer - Left: (P) Discontinue once straight leg raise with < 10 degree lag Restrictions Weight Bearing Restrictions: Yes LLE Weight Bearing: Weight bearing as tolerated      Mobility Bed Mobility Overal bed mobility: (P) Independent Bed Mobility: (P) Supine to Sit;Sit to Supine     Supine to sit: (P) Independent        Transfers                      Balance                                           ADL either performed or assessed with clinical judgement   ADL Overall ADL's : Needs assistance/impaired Eating/Feeding: Independent;Set up   Grooming: Wash/dry hands;Wash/dry  face;Applying deodorant;Brushing hair;Oral care;Independent;Set up           Upper Body Dressing : Independent;Set up   Lower Body Dressing: Supervision/safety;Set up;With adaptive equipment;Sit to/from stand                 General ADL Comments: Pt had a R TKA a year ago and is familiar with AD and will have her sister living with her for a month and then has sons and friends and a neighbor to help her as needed.     Vision Patient Visual Report: No change from baseline       Perception     Praxis      Pertinent Vitals/Pain Pain Assessment: No/denies pain Pain Location: (P) L knee  "I really don't have any pain"     Hand Dominance Right   Extremity/Trunk Assessment Upper Extremity Assessment Upper Extremity Assessment: Overall WFL for tasks assessed   Lower Extremity Assessment Lower Extremity Assessment: Defer to PT evaluation       Communication Communication Communication: No difficulties   Cognition Arousal/Alertness: Awake/alert Behavior During Therapy: WFL for tasks assessed/performed Overall Cognitive Status: Within Functional Limits for tasks assessed  General Comments       Exercises     Shoulder Instructions      Home Living Family/patient expects to be discharged to:: Private residence Living Arrangements: Alone Available Help at Discharge: Family Type of Home: House Home Access: Stairs to enter Technical brewer of Steps: 3 Entrance Stairs-Rails: Right;Left;Can reach both [place]: One level     Bathroom Shower/Tub: Teacher, early years/pre: Handicapped height Bathroom Accessibility: No   Home Equipment: [place] - single point;Walker - 2 wheels;Shower seat;Bedside commode          Prior Functioning/Environment Level of Independence: Independent        Comments: Independent with ADL/IADLs. Occasional assist from family with shopping. Drives. No falls in the  last 12 months        OT Problem List: Decreased strength;Decreased range of motion;Decreased activity tolerance      OT Treatment/Interventions: Self-care/ADL training;DME and/or AE instruction    OT Goals(Current goals can be found in the care plan section) Acute Rehab OT Goals Patient Stated Goal: "regain my independence again" OT Goal Formulation: With patient Time For Goal Achievement: 06/25/17 Potential to Achieve Goals: Good ADL Goals Pt Will Perform Lower Body Dressing: Independently;with adaptive equipment (may or noy not need AD for LB dressing) Pt Will Transfer to Toilet: with supervision;stand pivot transfer;regular height toilet (using FWW)  OT Frequency: Min 1X/week   Barriers to D/C:    lives at home alone---will have help from her sister for a month tho       Co-evaluation              AM-PAC PT "6 Clicks" Daily Activity     Outcome Measure Help from another person eating meals?: None Help from another person taking care of personal grooming?: None Help from another person toileting, which includes using toliet, bedpan, or urinal?: A Little Help from another person bathing (including washing, rinsing, drying)?: A Little Help from another person to put on and taking off regular upper body clothing?: None Help from another person to put on and taking off regular lower body clothing?: A Little 6 Click Score: 21   End of Session    Activity Tolerance: Patient tolerated treatment well Patient left: in bed;with call bell/phone within reach;with bed alarm set  OT Visit Diagnosis: Muscle weakness (generalized) (M62.81)                Time: 1500-1540 OT Time Calculation (min): 40 min Charges:  OT General Charges $OT Visit: 1 Procedure OT Evaluation $OT Eval Low Complexity: 1 Procedure OT Treatments $Self Care/Home Management : 23-37 mins G-Codes: OT G-codes **NOT FOR INPATIENT CLASS** Functional Assessment Tool Used: AM-PAC 6 Clicks Daily  Activity;Clinical judgement   Chrys Racer, OTR/L ascom [phone] 06/11/17, 4:21 PM

## 2017-06-11 NOTE — Anesthesia Postprocedure Evaluation (Signed)
Anesthesia Post Note  Patient: Heather Colon  Procedure(s) Performed: Procedure(s) (LRB): COMPUTER ASSISTED TOTAL KNEE ARTHROPLASTY (Left)  Patient location during evaluation: Nursing Unit Anesthesia Type: Spinal Level of consciousness: oriented and awake and alert Pain management: pain level controlled Vital Signs Assessment: post-procedure vital signs reviewed and stable Respiratory status: spontaneous breathing, respiratory function stable and patient connected to nasal cannula oxygen Cardiovascular status: blood pressure returned to baseline and stable Postop Assessment: no headache and no backache Anesthetic complications: no     Last Vitals:  Vitals:   06/11/17 0359 06/11/17 0749  BP: (!) 134/47 (!) 141/50  Pulse: (!) 59 62  Resp: 16 13  Temp: 36.4 C 36.6 C  SpO2: 95% 95%    Last Pain:  Vitals:   06/11/17 0749  TempSrc: Oral  PainSc:                  [name]

## 2017-06-11 NOTE — Progress Notes (Addendum)
   Subjective: 1 Day Post-Op Procedure(s) (LRB): COMPUTER ASSISTED TOTAL KNEE ARTHROPLASTY (Left) Patient reports pain as Discomfort but no real pain.   Patient is well, and has had no acute complaints or problems We will start therapy today.  Plan is to go Home after hospital stay. no nausea and no vomiting Patient denies any chest pains or shortness of breath. Patient rested well off and on during the night. No complaints  Objective: Vital signs in last 24 hours: Temp:  [97.2 F (36.2 C)-98.7 F (37.1 C)] 97.6 F (36.4 C) (08/21 0359) Pulse Rate:  [51-65] 59 (08/21 0359) Resp:  [14-20] 16 (08/21 0359) BP: (110-155)/(36-80) 134/47 (08/21 0359) SpO2:  [95 %-100 %] 95 % (08/21 0359) Original dressing in place Heels are non tender and elevated off the bed using rolled towels along with bone foam under operative leg Intake/Output from previous day: 08/20 0701 - 08/21 0700 In: 1451.7 [P.O.:240; I.V.:1111.7; IV Piggyback:100] Out: 8270 [Urine:1625; Drains:190; Blood:50] Intake/Output this shift: No intake/output data recorded.   Recent Labs  06/11/17 0457  HGB 11.2*    Recent Labs  06/11/17 0457  WBC 11.7*  RBC 3.52*  HCT 32.4*  PLT 177    Recent Labs  06/11/17 0457  NA 135  K 4.0  CL 105  CO2 23  BUN 29*  CREATININE 1.07*  GLUCOSE 117*  CALCIUM 8.7*   No results for input(s): LABPT, INR in the last 72 hours.  EXAM General - Patient is Alert, Appropriate and Oriented Extremity - Neurologically intact Neurovascular intact Sensation intact distally Intact pulses distally Dorsiflexion/Plantar flexion intact Compartment soft Dressing - dressing C/D/I Motor Function - intact, moving foot and toes well on exam. Able to do straight leg raise on his own  Past Medical History:  Diagnosis Date  . Arthritis   . Cancer ([place])    Basal Cell Skin Cancer  . Chronic kidney disease    phase 3  . Coronary artery disease   . Glaucoma    both eyes  .  Hypertension   . Hypothyroidism   . Osteoporosis     Assessment/Plan: 1 Day Post-Op Procedure(s) (LRB): COMPUTER ASSISTED TOTAL KNEE ARTHROPLASTY (Left) Active Problems:   S/P total knee arthroplasty  Estimated body mass index is 19.08 kg/m as calculated from the following:   Height as of this encounter: 5\' 1"  (1.549 m).   Weight as of this encounter: 45.8 kg (101 lb). Advance diet Up with therapy D/C IV fluids Plan for discharge tomorrow Discharge home with home health  Labs: Were reviewed and acceptable DVT Prophylaxis - Lovenox, Foot Pumps and TED hose Weight-Bearing as tolerated to left leg D/C O2 and Pulse OX and try on Room Air Begin working on bowel movement Labs tomorrow morning We'll keep close eye on urine output as well as BUN creatinine. Slightly elevated today but is normally slightly elevated prior to admission  Heather Colon 06/11/2017, 7:36 AM

## 2017-06-11 NOTE — Care Management Note (Signed)
Case Management Note  Patient Details  Name: Heather Colon MRN: [id number] Date of Birth: [birth year]  Subjective/Objective:  RNCM met with patient at bedside to discuss discharge planning. Patient lives at home alone. Her sister will be staying with her at discharge and she has a grandson next door. She has a walker, cane, BSC. No other DME needs identified. Offered choice of home health agencies. Referral to [facility] for HHPT. [facility]: [phone]. Called Lovenox 40 mg # 14 no refills.It is anticipated patient will be discharge tomorrow.                      Action/Plan: [facility] for HHPT, Lovenox called in.   Expected Discharge Date:  06/12/17               Expected Discharge Plan:  [place]  In-House Referral:     Discharge planning Services  CM Consult  Post Acute Care Choice:  Home Health Choice offered to:  Patient  DME Arranged:    DME Agency:     HH Arranged:  PT [place]:  [facility] (formerly [facility])  Status of Service:  In process, will continue to follow  If discussed at Long Length of Stay Meetings, dates discussed:    Additional Comments:  Jolly Mango, RN 06/11/2017, 4:20 PM

## 2017-06-11 NOTE — Discharge Summary (Addendum)
Physician Discharge Summary  Patient ID: Heather Colon MRN: [id number] DOB/AGE: [birth year] 83 y.o.  Admit date: 06/10/2017 Discharge date: 06/13/2017  Admission Diagnoses:  PRIMARY OSTEOARTHRITIS OF LEFT KNEE   Discharge Diagnoses: Patient Active Problem List   Diagnosis Date Noted  . S/P total knee arthroplasty 06/18/2016    Past Medical History:  Diagnosis Date  . Arthritis   . Cancer ([place])    Basal Cell Skin Cancer  . Chronic kidney disease    phase 3  . Coronary artery disease   . Glaucoma    both eyes  . Hypertension   . Hypothyroidism   . Osteoporosis      Transfusion: No transfusions during this admission but did receive a 500cc bulus   Consultants (if any): Hospitalist and Dr. Ubaldo Glassing. Secondary to increased heart rate, and hypotension. Patient also complaining of it felt like her chest was going to explode.  Discharged Condition: Improved  Hospital Course: Heather Colon is an 83 y.o. female who was admitted 06/10/2017 with a diagnosis of degenerative arthrosis right knee and went to the operating room on 06/10/2017 and underwent the above named procedures. On day of discharge the patient began having in HR and hypotension with chest tightness. Medicine and cardiology saw pt and adjusted medication and pt return to her previous admission state and was discharged   Surgeries:Procedure(s): [place] on 06/10/2017  PRE-OPERATIVE DIAGNOSIS: Degenerative arthrosis of the left knee, primary  POST-OPERATIVE DIAGNOSIS:  Same  PROCEDURE:  Left total knee arthroplasty using computer-assisted navigation  SURGEON:  Marciano Sequin. M.D.  ASSISTANT:  Vance Peper, PA (present and scrubbed throughout the case, critical for assistance with exposure, retraction, instrumentation, and closure)  ANESTHESIA: spinal  ESTIMATED BLOOD LOSS: 50 mL  FLUIDS REPLACED: 950 mL of crystalloid  TOURNIQUET TIME: 70 minutes  DRAINS: 2 medium  Hemovac drains  SOFT TISSUE RELEASES: Anterior cruciate ligament, posterior cruciate ligament, deep and superficial medial collateral ligament, patellofemoral ligament  IMPLANTS UTILIZED: DePuy Attune size 6N posterior stabilized femoral component (cemented), size 4 rotating platform tibial component (cemented), 35 mm medialized dome patella (cemented), and a 6 mm stabilized rotating platform polyethylene insert.  INDICATIONS FOR SURGERY: Heather Colon is a 83 y.o. year old female with a long history of progressive knee pain. X-rays demonstrated severe degenerative changes in tricompartmental fashion. The patient had not seen any significant improvement despite conservative nonsurgical intervention. After discussion of the risks and benefits of surgical intervention, the patient expressed understanding of the risks benefits and agree with plans for total knee arthroplasty.   The risks, benefits, and alternatives were discussed at length including but not limited to the risks of infection, bleeding, nerve injury, stiffness, blood clots, the need for revision surgery, cardiopulmonary complications, among others, and they were willing to proceed.  Patient tolerated the surgery well. No complications .Patient was taken to PACU where she was stabilized and then transferred to the orthopedic floor.  Patient started on Lovenox 30 mg q 24 hrs. Foot pumps applied bilaterally at 80 mm hgb. Heels elevated off bed with rolled towels. No evidence of DVT. Calves non tender. Negative Homan. Physical therapy started on day #1 for gait training and transfer with OT starting on  day #1 for ADL and assisted devices. Patient has done well with therapy. Ambulated greater than 200 feet upon being discharged.  Patient's IV And Foley were discontinued on day #1 with Hemovac being discontinued on day #2. Dressing was changed on day 2 prior  to patient being discharged   She was given perioperative antibiotics:   Anti-infectives    Start     Dose/Rate Route Frequency Ordered Stop   06/10/17 2000  ceFAZolin (ANCEF) IVPB 1 g/50 mL premix     1 g 100 mL/hr over 30 Minutes Intravenous Every 12 hours 06/10/17 1156 06/11/17 1959   06/10/17 0600  ceFAZolin (ANCEF) IVPB 2g/100 mL premix     2 g 200 mL/hr over 30 Minutes Intravenous On call to O.R. 06/09/17 2134 06/10/17 0758   06/10/17 0554  ceFAZolin (ANCEF) 2-4 GM/100ML-% IVPB    Comments:  Larita Fife   : cabinet override      06/10/17 0554 06/10/17 0748    .  She was fitted with AV 1 compression foot pump devices, instructed on heel pumps, early ambulation, and fitted with TED stockings bilaterally for DVT prophylaxis.  She benefited maximally from the hospital stay and there were no complications.    Recent vital signs:  Vitals:   06/10/17 2354 06/11/17 0359  BP: (!) 155/53 (!) 134/47  Pulse: 64 (!) 59  Resp: 18 16  Temp: 97.6 F (36.4 C) 97.6 F (36.4 C)  SpO2: 100% 95%    Recent laboratory studies:  Lab Results  Component Value Date   HGB 11.2 (L) 06/11/2017   HGB 13.1 05/28/2017   HGB 10.3 (L) 06/20/2016   Lab Results  Component Value Date   WBC 11.7 (H) 06/11/2017   PLT 177 06/11/2017   Lab Results  Component Value Date   INR 1.16 05/28/2017   Lab Results  Component Value Date   NA 135 06/11/2017   K 4.0 06/11/2017   CL 105 06/11/2017   CO2 23 06/11/2017   BUN 29 (H) 06/11/2017   CREATININE 1.07 (H) 06/11/2017   GLUCOSE 117 (H) 06/11/2017    Discharge Medications:   Allergies as of 06/12/2017      Reactions   Actonel [risedronate Sodium] Swelling   swelling of tongue, no breathing problems   Codeine Other (See Comments)   dizzy   Other Other (See Comments)   Pt does not tolerate strong pains very well - makes pt not herslf      Medication List    STOP taking these medications   aspirin EC 81 MG tablet     TAKE these medications   acetaminophen 325 MG tablet Commonly known as:  TYLENOL Take 325  mg by mouth every 6 (six) hours as needed for mild pain or moderate pain.   bimatoprost 0.01 % Soln Commonly known as:  LUMIGAN Place 1 drop into both eyes at bedtime.   enoxaparin 30 MG/0.3ML injection Commonly known as:  LOVENOX Inject 0.3 mLs (30 mg total) into the skin daily.   fluocinonide ointment 0.05 % Commonly known as:  LIDEX Apply 1 application topically daily as needed (rash).   ibandronate 150 MG tablet Commonly known as:  BONIVA Take 150 mg by mouth every 30 (thirty) days. Take in the morning with a full glass of water, on an empty stomach, and do not take anything else by mouth or lie down for the next 30 min.   liothyronine 25 MCG tablet Commonly known as:  CYTOMEL Take 25-37.5 mcg by mouth See admin instructions. 37.5 mcg on Mondays and Fridays, and 25 mcg all other days   losartan-hydrochlorothiazide 50-12.5 MG tablet Commonly known as:  HYZAAR Take 1 tablet by mouth daily at 12 noon.   NASACORT ALLERGY 24HR 55 MCG/ACT Aero  nasal inhaler Generic drug:  triamcinolone Place 1 spray into the nose daily.   oxyCODONE 5 MG immediate release tablet Commonly known as:  Oxy IR/ROXICODONE Take 1-2 tablets (5-10 mg total) by mouth every 4 (four) hours as needed for severe pain or breakthrough pain.   SYSTANE 0.4-0.3 % Soln Generic drug:  Polyethyl Glycol-Propyl Glycol Place 1 drop into both eyes daily as needed (for dry eyes).   traMADol 50 MG tablet Commonly known as:  ULTRAM Take 1-2 tablets (50-100 mg total) by mouth every 12 (twelve) hours as needed for moderate pain.   Verapamil HCl CR 200 MG Cp24 Take 1 capsule by mouth at bedtime.   Vitamin D 2000 units tablet Take 2,000 Units by mouth daily with lunch.   VOLTAREN 1 % Gel Generic drug:  diclofenac sodium Apply 2 g topically as needed (pain).            Durable Medical Equipment        Start     Ordered   06/10/17 1157  DME Walker rolling  Once    Question:  Patient needs a walker to treat  with the following condition  Answer:  Total knee replacement status   06/10/17 1156   06/10/17 1157  DME Bedside commode  Once    Question:  Patient needs a bedside commode to treat with the following condition  Answer:  Total knee replacement status   06/10/17 1156      Diagnostic Studies: Dg Knee Left Port  Result Date: 06/10/2017 CLINICAL DATA:  Status post left total knee arthroplasty. EXAM: PORTABLE LEFT KNEE - 1-2 VIEW COMPARISON:  None FINDINGS: Hardware components of a left total knee arthroplasty device identified. The hardware components are in anatomic alignment. No periprosthetic fracture identified. Skin staples and surgical drain identified. IMPRESSION: 1. Status post left total knee arthroplasty.  No complications. Electronically Signed   By: Kerby Moors M.D.   On: 06/10/2017 10:58    Disposition: 06-Home-Health Care Svc    Follow-up Information    Lattie Corns, PA-C On 06/26/2017.   Specialty:  Physician Assistant Why:  at 1:15pm Contact information: [place] [address] [phone]        Dereck Leep, MD On 07/23/2017.   Specialty:  Orthopedic Surgery Why:  at 10:00am Contact information: [place] [address] [phone]            Signed: Watt Climes 06/11/2017, 7:43 AM

## 2017-06-11 NOTE — Progress Notes (Signed)
Physical Therapy Treatment Patient Details Name: Heather Colon MRN: [id number] DOB: 25-Nov-1932 Today's Date: 06/11/2017    History of Present Illness Pt underwent L TKR without reported post-op complications. She is POD#0 at time of PT evaluation. PMH includes R TKR last year.     PT Comments    Participated in exercises as described below.  Pt without assist for bed mobility.  Supervision for transfers and min guard for gait around unit.  Overall progressing very well with mobility and ROM goals.  Little to no pain reported.  Pt does have stairs at home but should have no difficulty navigating.     Follow Up Recommendations  Home health PT     Equipment Recommendations       Recommendations for Other Services       Precautions / Restrictions Precautions Precautions: Knee Restrictions Weight Bearing Restrictions: Yes LLE Weight Bearing: Weight bearing as tolerated Other Position/Activity Restrictions: Pt able to complete SLR x 10 with good strength and no lag.  KI not used    Mobility  Bed Mobility Overal bed mobility: Independent                Transfers Overall transfer level: Needs assistance Equipment used: Rolling walker (2 wheeled) Transfers: Sit to/from Stand Sit to Stand: Supervision            Ambulation/Gait Ambulation/Gait assistance: Min guard Ambulation Distance (Feet): 200 Feet Assistive device: Rolling walker (2 wheeled) Gait Pattern/deviations: Step-through pattern Gait velocity: Decreased Gait velocity interpretation: Below normal speed for age/gender     Stairs            Wheelchair Mobility    Modified Rankin (Stroke Patients Only)       Balance Overall balance assessment: Needs assistance Sitting-balance support: Feet supported Sitting balance-Leahy Scale: Normal     Standing balance support: Bilateral upper extremity supported Standing balance-Leahy Scale: Fair                               Cognition Arousal/Alertness: Awake/alert Behavior During Therapy: WFL for tasks assessed/performed Overall Cognitive Status: Within Functional Limits for tasks assessed                                        Exercises Total Joint Exercises Ankle Circles/Pumps: AROM;Both;10 reps;Supine Quad Sets: Strengthening;Both;10 reps;Supine Gluteal Sets: Strengthening;Both;10 reps;Supine Towel Squeeze: Strengthening;Both;10 reps;Supine Short Arc Quad: Strengthening;Left;10 reps;Supine Heel Slides: Strengthening;Left;10 reps;Supine Hip ABduction/ADduction: Strengthening;Left;10 reps;Supine Straight Leg Raises: Strengthening;Left;10 reps;Supine Long Arc Quad: Strengthening;Seated;Left;10 reps Knee Flexion: AAROM;Seated;Left;10 reps Goniometric ROM: 1-105    General Comments        Pertinent Vitals/Pain Pain Assessment: 0-10 Pain Score: 2  Pain Location: L knee  "I really don't have any pain" Pain Intervention(s): Monitored during session;Ice applied    Home Living                      Prior Function            PT Goals (current goals can now be found in the care plan section) Progress towards PT goals: Progressing toward goals    Frequency    BID      PT Plan Current plan remains appropriate    Co-evaluation              AM-PAC PT "6  Clicks" Daily Activity  Outcome Measure  Difficulty turning over in bed (including adjusting bedclothes, sheets and blankets)?: None Difficulty moving from lying on back to sitting on the side of the bed? : None Difficulty sitting down on and standing up from a chair with arms (e.g., wheelchair, bedside commode, etc,.)?: None Help needed moving to and from a bed to chair (including a wheelchair)?: A Little Help needed walking in hospital room?: A Little Help needed climbing 3-5 steps with a railing? : A Little 6 Click Score: 21    End of Session Equipment Utilized During Treatment: Gait belt Activity  Tolerance: Patient tolerated treatment well Patient left: in chair;with chair alarm set;with call bell/phone within reach Nurse Communication: Mobility status Pain - Right/Left: Left Pain - part of body: Knee     Time: 1020-1035 PT Time Calculation (min) (ACUTE ONLY): 15 min  Charges:  $Gait Training: 8-22 mins                    G Codes:       [name], PTA 06/11/17, 11:28 AM

## 2017-06-11 NOTE — Progress Notes (Signed)
Physical Therapy Treatment Patient Details Name: Heather Colon MRN: [id number] DOB: 12/10/1932 Today's Date: 06/11/2017    History of Present Illness Pt underwent L TKR without reported post-op complications. She is POD#0 at time of PT evaluation. PMH includes R TKR last year.     PT Comments    Pt agreeable to PT; denies pain at rest, 2/10 with weight bearing. Pt progressing ambulation distance. Requires cues for knee flexion with swing phase on left. Educated on stair climbing. Pt performs well with cues and Min guard/supervision. Performs exercises for range/strength and improved functional bend in stand. Pt wished return to bed. Continue PT to progress range, strength, endurance to improve functional ability and allow for an optimal, safe return home.    Follow Up Recommendations  Home health PT     Equipment Recommendations  None recommended by PT;Other (comment)    Recommendations for Other Services       Precautions / Restrictions Precautions Precautions: Knee Knee Immobilizer - Left: Discontinue once straight leg raise with < 10 degree lag Restrictions Weight Bearing Restrictions: Yes LLE Weight Bearing: Weight bearing as tolerated    Mobility  Bed Mobility Overal bed mobility: Independent Bed Mobility: Supine to Sit;Sit to Supine     Supine to sit: Independent Sit to supine: Modified independent (Device/Increase time)   General bed mobility comments: Use of rails back to bed  Transfers Overall transfer level: Modified independent   Transfers: Sit to/from Stand Sit to Stand: Modified independent (Device/Increase time)         General transfer comment: Good use of hands. Safe  Ambulation/Gait Ambulation/Gait assistance: Supervision Ambulation Distance (Feet): 220 Feet Assistive device: Rolling walker (2 wheeled) Gait Pattern/deviations: Step-through pattern (stiff legged) Gait velocity: Decreased Gait velocity interpretation: Below normal speed for  age/gender General Gait Details: Cues to bend L knee through with swing phase   Stairs Stairs: Yes   Stair Management: Two rails;Step to pattern Number of Stairs: 3 General stair comments: Cues for sequence  Wheelchair Mobility    Modified Rankin (Stroke Patients Only)       Balance Overall balance assessment: Needs assistance Sitting-balance support: Feet supported Sitting balance-Leahy Scale: Normal     Standing balance support: Bilateral upper extremity supported Standing balance-Leahy Scale: Fair (Fair +)                              Cognition Arousal/Alertness: Awake/alert Behavior During Therapy: WFL for tasks assessed/performed Overall Cognitive Status: Within Functional Limits for tasks assessed                                        Exercises Total Joint Exercises Quad Sets: Strengthening;Both;20 reps;Supine (10 in stand) Knee Flexion: AROM;Left;10 reps;Seated (3 positions each rep with 10 sec hold each) Marching in Standing: AROM;Left;20 reps;Standing    General Comments        Pertinent Vitals/Pain Pain Assessment: No/denies pain Pain Score: 2  (with ambulation, 0 at rest) Pain Location: L knee    Home Living Family/patient expects to be discharged to:: Private residence Living Arrangements: Alone Available Help at Discharge: Family Type of Home: House Home Access: Stairs to enter Entrance Stairs-Rails: Right;Left;Can reach both Home Layout: One level Home Equipment: [place] - single point;Walker - 2 wheels;Shower seat;Bedside commode      Prior Function Level of Independence: Independent  Comments: Independent with ADL/IADLs. Occasional assist from family with shopping. Drives. No falls in the last 12 months   PT Goals (current goals can now be found in the care plan section) Acute Rehab PT Goals Patient Stated Goal: "regain my independence again" Progress towards PT goals: Progressing toward goals     Frequency    BID      PT Plan Current plan remains appropriate    Co-evaluation              AM-PAC PT "6 Clicks" Daily Activity  Outcome Measure  Difficulty turning over in bed (including adjusting bedclothes, sheets and blankets)?: A Little Difficulty moving from lying on back to sitting on the side of the bed? : None Difficulty sitting down on and standing up from a chair with arms (e.g., wheelchair, bedside commode, etc,.)?: None Help needed moving to and from a bed to chair (including a wheelchair)?: A Little Help needed walking in hospital room?: A Little Help needed climbing 3-5 steps with a railing? : A Little 6 Click Score: 20    End of Session Equipment Utilized During Treatment: Gait belt Activity Tolerance: Patient tolerated treatment well Patient left: in bed;with call bell/phone within reach;with bed alarm set;with SCD's reapplied;Other (comment) (polar care and bone foam)   PT Visit Diagnosis: Muscle weakness (generalized) (M62.81);Difficulty in walking, not elsewhere classified (R26.2);Pain Pain - Right/Left: Left Pain - part of body: Knee     Time: 9326-7124 PT Time Calculation (min) (ACUTE ONLY): 36 min  Charges:  $Gait Training: 8-22 mins $Therapeutic Exercise: 8-22 mins                    G Codes:        Larae Grooms, PTA 06/11/2017, 4:23 PM

## 2017-06-11 NOTE — Progress Notes (Signed)
Clinical Education officer, museum (CSW) received SNF consult. PT is recommending home health. RN case manager aware of above. CSW signing off. Please reconsult if future social work needs arise. CSW signing off.   [employer], LCSW [phone]

## 2017-06-11 NOTE — Progress Notes (Signed)
Pt rested quietly during the night. Encouraged to use incentive spirometer. Stated pain is minimal. Bone foam, polar care in place.  VSS . Foley draining yellow urine. No acute distress noted

## 2017-06-12 LAB — CBC
HCT: 32.1 % — ABNORMAL LOW (ref 35.0–47.0)
Hemoglobin: 11.1 g/dL — ABNORMAL LOW (ref 12.0–16.0)
MCH: 31.9 pg (ref 26.0–34.0)
MCHC: 34.7 g/dL (ref 32.0–36.0)
MCV: 92 fL (ref 80.0–100.0)
PLATELETS: 163 10*3/uL (ref 150–440)
RBC: 3.49 MIL/uL — ABNORMAL LOW (ref 3.80–5.20)
RDW: 13.6 % (ref 11.5–14.5)
WBC: 8.3 10*3/uL (ref 3.6–11.0)

## 2017-06-12 LAB — BASIC METABOLIC PANEL
Anion gap: 6 (ref 5–15)
BUN: 28 mg/dL — AB (ref 6–20)
CALCIUM: 8.9 mg/dL (ref 8.9–10.3)
CO2: 25 mmol/L (ref 22–32)
CREATININE: 1.32 mg/dL — AB (ref 0.44–1.00)
Chloride: 110 mmol/L (ref 101–111)
GFR calc Af Amer: 42 mL/min — ABNORMAL LOW (ref >60)
GFR, EST NON AFRICAN AMERICAN: 36 mL/min — AB (ref >60)
GLUCOSE: 87 mg/dL (ref 65–99)
Potassium: 3.7 mmol/L (ref 3.5–5.1)
Sodium: 141 mmol/L (ref 135–145)

## 2017-06-12 MED ORDER — SODIUM CHLORIDE 0.9 % IV BOLUS (SEPSIS)
500.0000 mL | Freq: Once | INTRAVENOUS | Status: AC
  Administered 2017-06-12: 500 mL via INTRAVENOUS

## 2017-06-12 MED ORDER — VERAPAMIL HCL ER 240 MG PO TBCR
240.0000 mg | EXTENDED_RELEASE_TABLET | Freq: Every day | ORAL | Status: DC
  Administered 2017-06-12: 240 mg via ORAL
  Filled 2017-06-12 (×2): qty 1

## 2017-06-12 MED ORDER — ENOXAPARIN SODIUM 30 MG/0.3ML ~~LOC~~ SOLN: 30.0000 mg | SUBCUTANEOUS | 0 refills | Status: DC

## 2017-06-12 MED ORDER — METOPROLOL TARTRATE 5 MG/5ML IV SOLN
2.5000 mg | Freq: Once | INTRAVENOUS | Status: AC
  Administered 2017-06-12: 2.5 mg via INTRAVENOUS
  Filled 2017-06-12: qty 5

## 2017-06-12 MED ORDER — LACTULOSE 10 GM/15ML PO SOLN: 10.0000 g | Freq: Two times a day (BID) | ORAL | Status: DC | PRN

## 2017-06-12 MED ORDER — DILTIAZEM HCL 25 MG/5ML IV SOLN
10.0000 mg | Freq: Four times a day (QID) | INTRAVENOUS | Status: DC | PRN
  Administered 2017-06-12: 10 mg via INTRAVENOUS
  Filled 2017-06-12 (×3): qty 5

## 2017-06-12 MED ORDER — VERAPAMIL HCL 40 MG PO TABS
40.0000 mg | ORAL_TABLET | Freq: Once | ORAL | Status: AC
  Administered 2017-06-12: 40 mg via ORAL
  Filled 2017-06-12: qty 1

## 2017-06-12 MED ORDER — OXYCODONE HCL 5 MG PO TABS: 5.0000 mg | ORAL_TABLET | ORAL | 0 refills | Status: DC | PRN

## 2017-06-12 MED ORDER — TRAMADOL HCL 50 MG PO TABS: 50.0000 mg | ORAL_TABLET | Freq: Two times a day (BID) | ORAL | 0 refills | Status: DC | PRN

## 2017-06-12 NOTE — Consult Note (Signed)
[place]  CARDIOLOGY CONSULT NOTE  Patient ID: Heather Colon MRN: [id number] DOB/AGE: 1932/12/04 83 y.o.  Admit date: 06/10/2017 Referring Physician Dr. Vianne Bulls Primary Physician   Primary Cardiologist Dr. Josefa Half Reason for Consultation afib with rvr  HPI: Stat consult called by floor regarding patient with atrial fibrillation with rapid ventricular response. Patient has a history of chronic atrial fibrillation treated with verapamil 180 mg daily. She underwent total knee arthroplasty. She was scheduled to go home today. She was noted this morning to have relative tachycardia. She also had evidence of acute on chronic renal insufficiency with a bump in her serum creatinine to 1.3 from 1.07. She is hemodynamically stable but electrocardiogram shows atrial fibrillation with rapid ventricular response. She was given 2.5 mg of metoprolol IV push. She took a verapamil 180 mg last p.m. and had the dose increased to 240 mg which will be scheduled this afternoon. She denies chest pain. She is not in pain. She is hemodynamically stable. She denies  shortness of breath.  Review of Systems  HENT: Negative.   Eyes: Negative.   Respiratory: Negative.   Cardiovascular: Negative.   Gastrointestinal: Negative.   Genitourinary: Negative.   Musculoskeletal: Positive for joint pain.  Skin: Negative.   Neurological: Positive for weakness.  Endo/Heme/Allergies: Negative.   Psychiatric/Behavioral: Negative.     Past Medical History:  Diagnosis Date  . Arthritis   . Cancer ([place])    Basal Cell Skin Cancer  . Chronic kidney disease    phase 3  . Coronary artery disease   . Glaucoma    both eyes  . Hypertension   . Hypothyroidism   . Osteoporosis     History reviewed. No pertinent family history.  Social History   Social History  . Marital status: Widowed    Spouse name: N/A  . Number of children: N/A  . Years of education: N/A    Occupational History  . Not on file.   Social History Main Topics  . Smoking status: Never Smoker  . Smokeless tobacco: Never Used  . Alcohol use No  . Drug use: No  . Sexual activity: No   Other Topics Concern  . Not on file   Social History Narrative  . No narrative on file    Past Surgical History:  Procedure Laterality Date  . ABDOMINAL HYSTERECTOMY    . CATARACT EXTRACTION W/ INTRAOCULAR LENS IMPLANT Left 2010  . CATARACT EXTRACTION W/ INTRAOCULAR LENS IMPLANT Right 2010  . EYE SURGERY Bilateral    Cataract Extraction with IOL  . KNEE ARTHROPLASTY Right 06/18/2016   Procedure: COMPUTER ASSISTED TOTAL KNEE ARTHROPLASTY;  Surgeon: Dereck Leep, MD;  Location: [facility] ORS;  Service: Orthopedics;  Laterality: Right;  . KNEE ARTHROPLASTY Left 06/10/2017   Procedure: COMPUTER ASSISTED TOTAL KNEE ARTHROPLASTY;  Surgeon: Dereck Leep, MD;  Location: [facility] ORS;  Service: Orthopedics;  Laterality: Left;  . MOUTH SURGERY  2017  . THYROID SURGERY Bilateral 1970's     Prescriptions Prior to Admission  Medication Sig Dispense Refill Last Dose  . acetaminophen (TYLENOL) 325 MG tablet Take 325 mg by mouth every 6 (six) hours as needed for mild pain or moderate pain.    Past Month at Unknown time  . aspirin EC 81 MG tablet Take 81 mg by mouth daily.   Past Week at Unknown time  . bimatoprost (LUMIGAN) 0.01 % SOLN Place 1 drop into both eyes at bedtime.  Past Week at Unknown time  . Cholecalciferol (VITAMIN D) 2000 units tablet Take 2,000 Units by mouth daily with lunch.    Past Week at Unknown time  . diclofenac sodium (VOLTAREN) 1 % GEL Apply 2 g topically as needed (pain).    Past Month at Unknown time  . fluocinonide ointment (LIDEX) 7.98 % Apply 1 application topically daily as needed (rash).    Past Month at Unknown time  . ibandronate (BONIVA) 150 MG tablet Take 150 mg by mouth every 30 (thirty) days. Take in the morning with a full glass of water, on an empty stomach, and do  not take anything else by mouth or lie down for the next 30 min.   Past Month at Unknown time  . liothyronine (CYTOMEL) 25 MCG tablet Take 25-37.5 mcg by mouth See admin instructions. 37.5 mcg on Mondays and Fridays, and 25 mcg all other days   06/10/2017 at Unknown time  . losartan-hydrochlorothiazide (HYZAAR) 50-12.5 MG tablet Take 1 tablet by mouth daily at 12 noon.    06/09/2017 at Unknown time  . Polyethyl Glycol-Propyl Glycol (SYSTANE) 0.4-0.3 % SOLN Place 1 drop into both eyes daily as needed (for dry eyes).    06/09/2017 at Unknown time  . triamcinolone (NASACORT ALLERGY 24HR) 55 MCG/ACT AERO nasal inhaler Place 1 spray into the nose daily.   Past Week at Unknown time  . Verapamil HCl CR 200 MG CP24 Take 1 capsule by mouth at bedtime.   06/09/2017 at Unknown time    Physical Exam: Blood pressure 138/62, pulse (!) 112, temperature 97.7 F (36.5 C), temperature source Oral, resp. rate 16, height 5\' 1"  (1.549 m), weight 45.8 kg (101 lb), SpO2 99 %.   Wt Readings from Last 1 Encounters:  06/10/17 45.8 kg (101 lb)     General appearance: alert and cooperative Resp: clear to auscultation bilaterally Cardio: irregularly irregular rhythm GI: soft, non-tender; bowel sounds normal; no masses,  no organomegaly Extremities: extremities normal, atraumatic, no cyanosis or edema Neurologic: Grossly normal  Labs:   Lab Results  Component Value Date   WBC 8.3 06/12/2017   HGB 11.1 (L) 06/12/2017   HCT 32.1 (L) 06/12/2017   MCV 92.0 06/12/2017   PLT 163 06/12/2017    Recent Labs Lab 06/12/17 0507  NA 141  K 3.7  CL 110  CO2 25  BUN 28*  CREATININE 1.32*  CALCIUM 8.9  GLUCOSE 87   Lab Results  Component Value Date   TROPONINI < 0.02 10/25/2013       EKG: Atrial fibrillation with rapid ventricular response  ASSESSMENT AND PLAN:  83 year old female with history of chronic atrial fibrillation who is status post knee arthroplasty. She developed A. fib with rapid ventricular response  post procedure. She is hemodynamically stable with this. She is on verapamil 180 mg every afternoon at home. She was given IV metoprolol at 2.5 mg with no improvement. She remains somewhat hypertensive and tachycardic. She is scheduled get 240 mg of by mouth verapamil this afternoon. Her creatinine is up to 1.3 from 1.0. She is hemodynamically stable. We'll give her IV fluid bolus. Will give her 40 mg of by mouth verapamil now. We'll give her the 240 mg this afternoon and follow her heart rate with the fluid bolus and he had additional verapamil. We'll watch overnight on telemetry and make further recommendations based on the heart rate. Signed: Teodoro Spray MD, [facility] 06/12/2017, 4:23 PM

## 2017-06-12 NOTE — Care Management Important Message (Signed)
Important Message  Patient Details  Name: Heather Colon MRN: [id number] Date of Birth: [birth year]   Medicare Important Message Given:  N/A - LOS <3 / Initial given by admissions    Jolly Mango, RN 06/12/2017, 8:25 AM

## 2017-06-12 NOTE — Progress Notes (Signed)
   Subjective: 2 Days Post-Op Procedure(s) (LRB): COMPUTER ASSISTED TOTAL KNEE ARTHROPLASTY (Left) Patient reports pain as 0 on 0-10 scale.   Patient is well, and has had no acute complaints or problems Continue with therapy Plan is to go Home after hospital stay. no nausea and no vomiting Patient denies any chest pains or shortness of breath. Objective: Vital signs in last 24 hours: Temp:  [96.7 F (35.9 C)-98.4 F (36.9 C)] 98.4 F (36.9 C) (08/21 2053) Pulse Rate:  [62-81] 81 (08/21 2053) Resp:  [13-18] 18 (08/21 2053) BP: (141-170)/(44-57) 170/57 (08/21 2053) SpO2:  [95 %-97 %] 97 % (08/21 2053) well approximated incision Heels are non tender and elevated off the bed using rolled towels Intake/Output from previous day: 08/21 0701 - 08/22 0700 In: 3521.7 [P.O.:840; I.V.:2681.7] Out: 575 [Urine:425; Drains:150] Intake/Output this shift: No intake/output data recorded.   Recent Labs  06/11/17 0457 06/12/17 0507  HGB 11.2* 11.1*    Recent Labs  06/11/17 0457 06/12/17 0507  WBC 11.7* 8.3  RBC 3.52* 3.49*  HCT 32.4* 32.1*  PLT 177 163    Recent Labs  06/11/17 0457 06/12/17 0507  NA 135 141  K 4.0 3.7  CL 105 110  CO2 23 25  BUN 29* 28*  CREATININE 1.07* 1.32*  GLUCOSE 117* 87  CALCIUM 8.7* 8.9   No results for input(s): LABPT, INR in the last 72 hours.  EXAM General - Patient is Alert, Appropriate and Oriented Extremity - Neurologically intact Neurovascular intact Sensation intact distally Intact pulses distally Dorsiflexion/Plantar flexion intact No cellulitis present Compartment soft Dressing - scant drainage Motor Function - intact, moving foot and toes well on exam.    Past Medical History:  Diagnosis Date  . Arthritis   . Cancer ([place])    Basal Cell Skin Cancer  . Chronic kidney disease    phase 3  . Coronary artery disease   . Glaucoma    both eyes  . Hypertension   . Hypothyroidism   . Osteoporosis     Assessment/Plan: 2  Days Post-Op Procedure(s) (LRB): COMPUTER ASSISTED TOTAL KNEE ARTHROPLASTY (Left) Active Problems:   S/P total knee arthroplasty  Estimated body mass index is 19.08 kg/m as calculated from the following:   Height as of this encounter: 5\' 1"  (1.549 m).   Weight as of this encounter: 45.8 kg (101 lb). Up with therapy Discharge home with home health  Labs: reviewed DVT Prophylaxis - Lovenox, Foot Pumps and TED hose Weight-Bearing as tolerated to left leg hemovac d/c'd Please wash operative leg and apply TED stocking to both legs Please change dressing prior to d/c and give the pt 2 extra honeycomb dressing to take home. Bone foam to go home with pt.  Jillyn Ledger. [place] 06/12/2017, 7:01 AM

## 2017-06-12 NOTE — Progress Notes (Signed)
Am VS collected approx 0800 to reveal BP 187/97, HR 136 scheduled medication. Administered scheduled meds, recheck BP to reveal 164/91, HR 142. Patient with some complaints "feels like my heart up in my throat." Called Ortho PA, new orders for medical consult, post pone discharge. Paged MD Vianne Bulls) to alert of condition. New orders EKG and Metoprolol 2.5mg  post EKG, cardiac monitoring, and possible bolus. Post EKG med admin, BP recheck 143/62, HR131, recheck 45min. 130/79, HR 119. Paged MD once more, new orders for cardiology consult and add medication. Card MD assessed pt adjusted medications, one time bolus, and made recommendations. Continued to monitor, HR x 2 ion 150s. Paged prime doc, new orders to transfer to 2A for further monitoring, additional medications.

## 2017-06-12 NOTE — Care Management Note (Signed)
Case Management Note  Patient Details  Name: Yamaira Spinner MRN: [id number] Date of Birth: 1933/06/05  Subjective/Objective: Discharging today                   Action/Plan: Cost of Lovenox is $ 64.00. Patient updated and denies issues paying for medication.  No DME. [facility] notified of discharge.  Expected Discharge Date:  06/12/17               Expected Discharge Plan:  [place]  In-House Referral:     Discharge planning Services  CM Consult  Post Acute Care Choice:  Home Health Choice offered to:  Patient  DME Arranged:    DME Agency:     HH Arranged:  PT [place]:  [facility] (formerly [employer])  Status of Service:  Completed, signed off  If discussed at [employer], dates discussed:    Additional Comments:  Jolly Mango, RN 06/12/2017, 8:24 AM

## 2017-06-12 NOTE — Progress Notes (Signed)
Pt. Seen earlier by Dr. Vianne Bulls for consult for a. Fib w/ RVR and AKI.   Called by nurse as patients HR is sustaining in the 150's.   Will transfer to tele.  Give IV Cardizem push 10 mg.  May need to be on a drip. Dr. Ubaldo Glassing Cardiology following.

## 2017-06-12 NOTE — Consult Note (Signed)
Patient Demographics  Heather Colon, is a 83 y.o. female   MRN: [id number]   DOB - 1933/04/10  Admit Date - 06/10/2017    Outpatient Primary MD for the patient is Kirk Ruths, MD  Consult requested in the Hospital by [facility], Laurice Record, MD, On 06/12/2017    Reason for consult ; A. fib with RVR.   History of present illness: 83 year old female patient with history of left knee total knee arthroplasty by Dr. Marry Guan August 20, supposed to go home today to follow up with the elevated blood pressure, elevated heart rate up to 1 42 bpm. Patient also felt somewhat diaphoretic when she heart rate went up to 1 42 bpm. Denies chest pain or shortness of breath, no abdominal pain, no constipation. No cough. Blood pressure this morning elevated at 187/87, heart rate 136. EKG showed atrial flutter 128 beats per minute.  Past Medical History:  Diagnosis Date  . Arthritis   . Cancer ([place])    Basal Cell Skin Cancer  . Chronic kidney disease    phase 3  . Coronary artery disease   . Glaucoma    both eyes  . Hypertension   . Hypothyroidism   . Osteoporosis       Past Surgical History:  Procedure Laterality Date  . ABDOMINAL HYSTERECTOMY    . CATARACT EXTRACTION W/ INTRAOCULAR LENS IMPLANT Left 2010  . CATARACT EXTRACTION W/ INTRAOCULAR LENS IMPLANT Right 2010  . EYE SURGERY Bilateral    Cataract Extraction with IOL  . KNEE ARTHROPLASTY Right 06/18/2016   Procedure: COMPUTER ASSISTED TOTAL KNEE ARTHROPLASTY;  Surgeon: Dereck Leep, MD;  Location: [facility] ORS;  Service: Orthopedics;  Laterality: Right;  . KNEE ARTHROPLASTY Left 06/10/2017   Procedure: COMPUTER ASSISTED TOTAL KNEE ARTHROPLASTY;  Surgeon: Dereck Leep, MD;  Location: [facility] ORS;  Service: Orthopedics;  Laterality: Left;  . MOUTH SURGERY  2017  . THYROID SURGERY  Bilateral 1970's       Review of Systems    In addition to the HPI above,  No Fever-chills, No Headache, No changes with Vision or hearing, No problems swallowing food or Liquids, No Chest pain, Cough or Shortness of Breath, No Abdominal pain, No Nausea or Vommitting, Bowel movements are regular, No Blood in stool or Urine, No dysuria, No new skin rashes or bruises, Status post left knee repair  No new weakness, tingling, numbness in any extremity, No recent weight gain or loss, No polyuria, polydypsia or polyphagia, No significant Mental Stressors.  A full 10 point Review of Systems was done, except as stated above, all other Review of Systems were negative.   Social History  Family History History reviewed. No pertinent family history.   Prior to Admission medications   Medication Sig Start Date End Date Taking? Authorizing Provider  acetaminophen (TYLENOL) 325 MG tablet Take 325 mg by mouth every 6 (six) hours as needed for mild pain or moderate pain.    Yes [provider]  aspirin EC 81  MG tablet Take 81 mg by mouth daily.   Yes [provider]  bimatoprost (LUMIGAN) 0.01 % SOLN Place 1 drop into both eyes at bedtime.    Yes [provider]  Cholecalciferol (VITAMIN D) 2000 units tablet Take 2,000 Units by mouth daily with lunch.    Yes [provider]  diclofenac sodium (VOLTAREN) 1 % GEL Apply 2 g topically as needed (pain).    Yes [provider]  fluocinonide ointment (LIDEX) 5.09 % Apply 1 application topically daily as needed (rash).    Yes [provider]  ibandronate (BONIVA) 150 MG tablet Take 150 mg by mouth every 30 (thirty) days. Take in the morning with a full glass of water, on an empty stomach, and do not take anything else by mouth or lie down for the next 30 min.   Yes [provider]  liothyronine (CYTOMEL) 25 MCG tablet Take 25-37.5 mcg by mouth See admin instructions. 37.5 mcg on Mondays  and Fridays, and 25 mcg all other days   Yes [provider]  losartan-hydrochlorothiazide (HYZAAR) 50-12.5 MG tablet Take 1 tablet by mouth daily at 12 noon.    Yes [provider]  Polyethyl Glycol-Propyl Glycol (SYSTANE) 0.4-0.3 % SOLN Place 1 drop into both eyes daily as needed (for dry eyes).    Yes [provider]  triamcinolone (NASACORT ALLERGY 24HR) 55 MCG/ACT AERO nasal inhaler Place 1 spray into the nose daily.   Yes [provider]  Verapamil HCl CR 200 MG CP24 Take 1 capsule by mouth at bedtime.   Yes [provider]  enoxaparin (LOVENOX) 30 MG/0.3ML injection Inject 0.3 mLs (30 mg total) into the skin daily. 06/12/17   Watt Climes, PA  oxyCODONE (OXY IR/ROXICODONE) 5 MG immediate release tablet Take 1-2 tablets (5-10 mg total) by mouth every 4 (four) hours as needed for severe pain or breakthrough pain. 06/12/17   Watt Climes, PA  traMADol (ULTRAM) 50 MG tablet Take 1-2 tablets (50-100 mg total) by mouth every 12 (twelve) hours as needed for moderate pain. 06/12/17   Watt Climes, PA    Anti-infectives    Start     Dose/Rate Route Frequency Ordered Stop   06/10/17 2000  ceFAZolin (ANCEF) IVPB 1 g/50 mL premix     1 g 100 mL/hr over 30 Minutes Intravenous Every 12 hours 06/10/17 1156 06/11/17 0938   06/10/17 0600  ceFAZolin (ANCEF) IVPB 2g/100 mL premix     2 g 200 mL/hr over 30 Minutes Intravenous On call to O.R. 06/09/17 2134 06/10/17 0758   06/10/17 0554  ceFAZolin (ANCEF) 2-4 GM/100ML-% IVPB    Comments:  Larita Fife   : cabinet override      06/10/17 0554 06/10/17 0748      Scheduled Meds: . celecoxib  200 mg Oral Q12H  . cholecalciferol  2,000 Units Oral Q lunch  . enoxaparin (LOVENOX) injection  30 mg Subcutaneous Q24H  . ferrous sulfate  325 mg Oral BID WC  . fluticasone  1 spray Each Nare Daily  . losartan  50 mg Oral Daily   And  . hydrochlorothiazide  12.5 mg Oral Daily  . latanoprost  1 drop Both Eyes QHS  .  liothyronine  25 mcg Oral Once per day on Sun Tue Wed Thu Sat  . [START ON 06/14/2017] liothyronine  37.5 mcg Oral Once per day on Mon Fri  . pantoprazole  40 mg Oral BID  . senna-docusate  1  tablet Oral BID  . verapamil  240 mg Oral QHS   Continuous Infusions: . sodium chloride Stopped (06/11/17 1830)    Allergies  Allergen Reactions  . Actonel [Risedronate Sodium] Swelling    swelling of tongue, no breathing problems  . Codeine Other (See Comments)    dizzy  . Other Other (See Comments)    Pt does not tolerate strong pains very well - makes pt not herslf    Physical Exam  Vitals  Blood pressure 138/62, pulse (!) 112, temperature 97.7 F (36.5 C), temperature source Oral, resp. rate 16, height 5\' 1"  (1.549 m), weight 45.8 kg (101 lb), SpO2 99 %.   1. General Alert, awake, oriented  lying in bed in NAD,    2. Normal affect and insight, Not Suicidal or Homicidal, Awake Alert, Oriented X 3.  3. No F.N deficits, ALL C.Nerves Intact, Strength 5/5 all 4 extremities, Sensation intact all 4 extremities, Plantars down going.  4. Ears and Eyes appear Normal, Conjunctivae clear, PERRLA. Moist Oral Mucosa.  5. Supple Neck, No JVD, No cervical lymphadenopathy appriciated, No Carotid Bruits.  6. Symmetrical Chest wall movement, Good air movement bilaterally, CTAB.  7.  irregularly irregular. No murmurs.  .8. Positive Bowel Sounds, Abdomen Soft, No tenderness, No organomegaly appriciated,No rebound -guarding or rigidity.  9.  No Cyanosis, Normal Skin Turgor, No Skin Rash or Bruise.  10. Good muscle tone,  joints appear normal , no effusions, Normal ROM.  11. No Palpable Lymph Nodes in Neck or Axillae    Data Review  CBC  Recent Labs Lab 06/11/17 0457 06/12/17 0507  WBC 11.7* 8.3  HGB 11.2* 11.1*  HCT 32.4* 32.1*  PLT 177 163  MCV 91.9 92.0  MCH 31.9 31.9  MCHC 34.7 34.7  RDW 13.5 13.6    ------------------------------------------------------------------------------------------------------------------  Chemistries   Recent Labs Lab 06/11/17 0457 06/12/17 0507  NA 135 141  K 4.0 3.7  CL 105 110  CO2 23 25  GLUCOSE 117* 87  BUN 29* 28*  CREATININE 1.07* 1.32*  CALCIUM 8.7* 8.9   ------------------------------------------------------------------------------------------------------------------ estimated creatinine clearance is 23.8 mL/min (A) (by C-G formula based on SCr of 1.32 mg/dL (H)). ------------------------------------------------------------------------------------------------------------------ No results for input(s): TSH, T4TOTAL, T3FREE, THYROIDAB in the last 72 hours.  Invalid input(s): FREET3   Coagulation profile No results for input(s): INR, PROTIME in the last 168 hours. ------------------------------------------------------------------------------------------------------------------- No results for input(s): DDIMER in the last 72 hours. -------------------------------------------------------------------------------------------------------------------  Cardiac Enzymes No results for input(s): CKMB, TROPONINI, MYOGLOBIN in the last 168 hours.  Invalid input(s): CK ------------------------------------------------------------------------------------------------------------------ Invalid input(s): POCBNP   ---------------------------------------------------------------------------------------------------------------  Urinalysis    Component Value Date/Time   COLORURINE YELLOW (A) 05/28/2017 0931   APPEARANCEUR CLEAR (A) 05/28/2017 0931   LABSPEC 1.009 05/28/2017 0931   PHURINE 5.0 05/28/2017 0931   GLUCOSEU NEGATIVE 05/28/2017 0931   HGBUR SMALL (A) 05/28/2017 0931   BILIRUBINUR NEGATIVE 05/28/2017 0931   KETONESUR NEGATIVE 05/28/2017 0931   PROTEINUR NEGATIVE 05/28/2017 0931   NITRITE NEGATIVE 05/28/2017 0931   LEUKOCYTESUR SMALL (A)  05/28/2017 0931     Imaging results:   No results found.  EKG shows atrial flutter with 1 28 bpm with variable AV block. No ST T changes.  Assessment & Plan  Active Problems:   S/P total knee arthroplasty   #1 atrial fibrillation with RVR: The heart rate up to 142 bpm at rest this morning. Patient did not have any chest pain, shortness of breath. Patient received a dose of metoprolol  2.5 mg IV push, started back on her home dose  Verapamil .she is already on that but at a lower dose. Seen by Dr.Fath from cardiology. Continue to watch heart rate, monitor on telemetry. Still tachycardic with heart rate up to and 12 bpm. Continue verapamil  CRat 240 mg daily, use Cardizem drip for rate control if patient is persistently tachycardic. Patient told me that Dr. Saralyn Pilar  Is  planning on adjusting the medications after her surgery and also starting on on blood thinner.  2.acute renal failure with ATN likely due to dehydration. Patient has BUN 28 creatinine 1.3 today. About 500 cc of NS fluid bolus ordered . #3. essential hypertension with elevated blood pressure this morning. Blood pressure is better now. Continue home medicine with losartan, verapamil.  DVT Prophylaxis;Lovenox. AM Labs Ordered, also please review Full Orders  Family Communication: Plan discussed with patient and her sister   Thank you for the consult, we will follow the patient with you in the Hospital.   [facility] M.D on 06/12/2017 at 2:58 PM  Note: This dictation was prepared with Dragon dictation along with smaller phrase technology. Any transcriptional errors that result from this process are unintentional.

## 2017-06-12 NOTE — Progress Notes (Signed)
Physical Therapy Treatment Patient Details Name: Heather Colon MRN: [id number] DOB: [birth year] Today's Date: 06/12/2017    History of Present Illness Pt underwent L TKR without reported post-op complications. She is POD#0 at time of PT evaluation. PMH includes R TKR last year.     PT Comments    Pt agreeable to PT; denies much pain and reports only taking Tylenol. Per chart review and check at beginning of session, pt heart rate is running high this morning. Currently 142 beats per minute at rest. Pt denies any other adverse symptoms. Deferred up/out of bed. Pt performs quad sets and active range of motion of left knee without increase in heart rate. Range left knee 0-106 degrees. Pt educated on carryover for stretching to home environment. Pt has a current discharge to home today. Will watch chart due to onset of increased heart rate for possible continued admission and treat accordingly.     Follow Up Recommendations  Home health PT     Equipment Recommendations  None recommended by PT;Other (comment)    Recommendations for Other Services       Precautions / Restrictions Precautions Precautions: Knee;Fall Restrictions Weight Bearing Restrictions: Yes LLE Weight Bearing: Weight bearing as tolerated    Mobility  Bed Mobility               General bed mobility comments: Not tested; pt with high heart rate (142 bpm) this morning at rest. Has been high this morn and nursing aware  Transfers                    Ambulation/Gait                 Stairs            Wheelchair Mobility    Modified Rankin (Stroke Patients Only)       Balance                                            Cognition Arousal/Alertness: Awake/alert Behavior During Therapy: WFL for tasks assessed/performed Overall Cognitive Status: Within Functional Limits for tasks assessed                                        Exercises Total Joint  Exercises Quad Sets: Strengthening;Both;20 reps;Supine Knee Flexion: AROM;Left;Supine;Other (comment) (6 min) Goniometric ROM: 0-106 Other Exercises Other Exercises: Educated on stretching carryover to home    General Comments        Pertinent Vitals/Pain Pain Assessment: 0-10 Pain Score: 1  Pain Location: L knee Pain Intervention(s): Premedicated before session;Other (comment) (tylenol subjectively)    Home Living                      Prior Function            PT Goals (current goals can now be found in the care plan section) Progress towards PT goals:  (limited by HR this morn)    Frequency    BID      PT Plan Current plan remains appropriate    Co-evaluation              AM-PAC PT "6 Clicks" Daily Activity  Outcome Measure  Difficulty turning over in bed (including adjusting bedclothes,  sheets and blankets)?: A Little Difficulty moving from lying on back to sitting on the side of the bed? : None Difficulty sitting down on and standing up from a chair with arms (e.g., wheelchair, bedside commode, etc,.)?: None Help needed moving to and from a bed to chair (including a wheelchair)?: A Little Help needed walking in hospital room?: A Little Help needed climbing 3-5 steps with a railing? : A Little 6 Click Score: 20    End of Session   Activity Tolerance: Patient tolerated treatment well;Other (comment) (no further inrease in HR) Patient left: in bed;with call bell/phone within reach;with bed alarm set;with SCD's reapplied;Other (comment) (polar care)   PT Visit Diagnosis: Muscle weakness (generalized) (M62.81);Difficulty in walking, not elsewhere classified (R26.2);Pain Pain - Right/Left: Left Pain - part of body: Knee     Time: 6222-9798 PT Time Calculation (min) (ACUTE ONLY): 16 min  Charges:  $Therapeutic Exercise: 8-22 mins                    G Codes:       Larae Grooms, PTA 06/12/2017, 10:17 AM

## 2017-06-13 LAB — BASIC METABOLIC PANEL
Anion gap: 9 (ref 5–15)
BUN: 21 mg/dL — AB (ref 6–20)
CHLORIDE: 103 mmol/L (ref 101–111)
CO2: 24 mmol/L (ref 22–32)
CREATININE: 0.87 mg/dL (ref 0.44–1.00)
Calcium: 8.9 mg/dL (ref 8.9–10.3)
GFR calc Af Amer: >60 mL/min (ref >60)
GFR calc non Af Amer: >60 mL/min (ref >60)
Glucose, Bld: 96 mg/dL (ref 65–99)
POTASSIUM: 3.7 mmol/L (ref 3.5–5.1)
Sodium: 136 mmol/L (ref 135–145)

## 2017-06-13 LAB — CBC
HEMATOCRIT: 34.8 % — AB (ref 35.0–47.0)
Hemoglobin: 12 g/dL (ref 12.0–16.0)
MCH: 31.1 pg (ref 26.0–34.0)
MCHC: 34.4 g/dL (ref 32.0–36.0)
MCV: 90.5 fL (ref 80.0–100.0)
PLATELETS: 192 10*3/uL (ref 150–440)
RBC: 3.84 MIL/uL (ref 3.80–5.20)
RDW: 13.8 % (ref 11.5–14.5)
WBC: 9.5 10*3/uL (ref 3.6–11.0)

## 2017-06-13 MED ORDER — METOPROLOL TARTRATE 25 MG PO TABS
25.0000 mg | ORAL_TABLET | Freq: Two times a day (BID) | ORAL | Status: DC
  Administered 2017-06-13: 25 mg via ORAL
  Filled 2017-06-13: qty 1

## 2017-06-13 MED ORDER — METOPROLOL TARTRATE 50 MG PO TABS
50.0000 mg | ORAL_TABLET | Freq: Every day | ORAL | 0 refills | Status: DC
Start: 2017-06-13 — End: 2017-06-13

## 2017-06-13 MED ORDER — DEXTROSE 5 % IV SOLN
5.0000 mg/h | INTRAVENOUS | Status: DC
  Administered 2017-06-13: 5 mg/h via INTRAVENOUS
  Filled 2017-06-13: qty 100

## 2017-06-13 MED ORDER — METOPROLOL TARTRATE 25 MG PO TABS: 25.0000 mg | ORAL_TABLET | Freq: Two times a day (BID) | ORAL | 0 refills | Status: DC

## 2017-06-13 MED ORDER — VERAPAMIL HCL ER 240 MG PO TBCR: 240.0000 mg | EXTENDED_RELEASE_TABLET | Freq: Every day | ORAL | 0 refills | Status: DC

## 2017-06-13 MED ORDER — DILTIAZEM HCL 60 MG PO TABS
60.0000 mg | ORAL_TABLET | Freq: Four times a day (QID) | ORAL | Status: DC
  Administered 2017-06-13: 60 mg via ORAL
  Filled 2017-06-13 (×2): qty 1

## 2017-06-13 NOTE — Progress Notes (Signed)
Pt transferred for 1A. Pt A&O. Telemetry monitor applied and called to CCMD. No complaints of pain at this time.

## 2017-06-13 NOTE — Progress Notes (Signed)
PT Cancellation Note  Patient Details Name: Heather Colon MRN: [id number] DOB: 1933/02/14   Cancelled Treatment:    Reason Eval/Treat Not Completed: Other (comment). Checked with CM initially and agreed to check with pt for any needs/concerns before returning home. Treatment attempted, pt sitting in visitor chair dressed to go home. Brief discussion regarding discharging home and monitoring heart rate. Pt with confusion between BP values and heart rate values. Education provided, nurse entered to discharge pt home.    Larae Grooms, PTA 06/13/2017, 3:37 PM

## 2017-06-13 NOTE — Care Management (Signed)
For discharge home today.  Per previous CM, no DME needs.  [facility] notified of discharge.

## 2017-06-13 NOTE — Progress Notes (Signed)
       [facility]  SUBJECTIVE: no complaints   Vitals:   06/12/17 2310 06/13/17 0505 06/13/17 0523 06/13/17 0544  BP: (!) 152/96 136/76 136/81 119/65  Pulse: (!) 139 (!) 136 (!) 124 84  Resp:  18    Temp:  97.8 F (36.6 C)    TempSrc:  Oral    SpO2:  96%    Weight:      Height:        Intake/Output Summary (Last 24 hours) at 06/13/17 0818 Last data filed at 06/13/17 0521  Gross per 24 hour  Intake           666.67 ml  Output             1200 ml  Net          -533.33 ml    LABS: Basic Metabolic Panel:  Recent Labs  06/11/17 0457 06/12/17 0507  NA 135 141  K 4.0 3.7  CL 105 110  CO2 23 25  GLUCOSE 117* 87  BUN 29* 28*  CREATININE 1.07* 1.32*  CALCIUM 8.7* 8.9   Liver Function Tests: No results for input(s): AST, ALT, ALKPHOS, BILITOT, PROT, ALBUMIN in the last 72 hours. No results for input(s): LIPASE, AMYLASE in the last 72 hours. CBC:  Recent Labs  06/11/17 0457 06/12/17 0507  WBC 11.7* 8.3  HGB 11.2* 11.1*  HCT 32.4* 32.1*  MCV 91.9 92.0  PLT 177 163   Cardiac Enzymes: No results for input(s): CKTOTAL, CKMB, CKMBINDEX, TROPONINI in the last 72 hours. BNP: Invalid input(s): POCBNP D-Dimer: No results for input(s): DDIMER in the last 72 hours. Hemoglobin A1C: No results for input(s): HGBA1C in the last 72 hours. Fasting Lipid Panel: No results for input(s): CHOL, HDL, LDLCALC, TRIG, CHOLHDL, LDLDIRECT in the last 72 hours. Thyroid Function Tests: No results for input(s): TSH, T4TOTAL, T3FREE, THYROIDAB in the last 72 hours.  Invalid input(s): FREET3 Anemia Panel: No results for input(s): VITAMINB12, FOLATE, FERRITIN, TIBC, IRON, RETICCTPCT in the last 72 hours.   Physical Exam: Blood pressure 119/65, pulse 84, temperature 97.8 F (36.6 C), temperature source Oral, resp. rate 18, height 5\' 1"  (1.549 m), weight 49.4 kg (108 lb 14.4 oz), SpO2 96 %.   Wt Readings from Last 1 Encounters:  06/12/17  49.4 kg (108 lb 14.4 oz)     General appearance: alert and cooperative Resp: clear to auscultation bilaterally Cardio: irregularly irregular rhythm GI: soft, non-tender; bowel sounds normal; no masses,  no organomegaly Extremities: extremities normal, atraumatic, no cyanosis or edema  TELEMETRY: Reviewed telemetry pt in afib with rvr  ASSESSMENT AND PLAN:  Active Problems:   S/P total knee arthroplasty-doing well post knee arthroplasty. Doing well. Followed by ortho  afib-rate still somewhat elevated, improved somewhat with iv cardizem. WIll convert to po at 60 q 6. Continue with verapamil. Will consider anticoagulation as outpatient.    Teodoro Spray, MD, [facility] 06/13/2017 8:18 AM

## 2017-06-13 NOTE — Progress Notes (Signed)
Physical Therapy Treatment Patient Details Name: Heather Colon MRN: [id number] DOB: December 22, 1932 Today's Date: 06/13/2017    History of Present Illness Pt underwent L TKR without reported post-op complications. She is POD#0 at time of PT evaluation. PMH includes R TKR last year.     PT Comments    Pt checked again this am.  HR more stable and remained less than 120 during session.  Limited to supine exercises only this am and stretching on L knee in supine.  Will see pt again this pm with hopes of mobility skills if ok with nursing.   Follow Up Recommendations  Home health PT     Equipment Recommendations  None recommended by PT;Other (comment)    Recommendations for Other Services       Precautions / Restrictions Precautions Precautions: Knee;Fall;Other (comment) Precaution Comments: Heart rate Restrictions Weight Bearing Restrictions: Yes LLE Weight Bearing: Weight bearing as tolerated    Mobility  Bed Mobility                  Transfers                    Ambulation/Gait                 Stairs            Wheelchair Mobility    Modified Rankin (Stroke Patients Only)       Balance                                            Cognition Arousal/Alertness: Awake/alert Behavior During Therapy: WFL for tasks assessed/performed Overall Cognitive Status: Within Functional Limits for tasks assessed                                        Exercises Total Joint Exercises Ankle Circles/Pumps: AROM;Both;10 reps;Supine Quad Sets: 10 reps;Strengthening;Supine;Left Short Arc Quad: Strengthening;Left;10 reps;Supine Heel Slides: Strengthening;Left;10 reps;Supine Hip ABduction/ADduction: Strengthening;Left;10 reps;Supine Knee Flexion: AROM;Left;Supine;Other (comment)    General Comments        Pertinent Vitals/Pain Pain Assessment: No/denies pain    Home Living                      Prior  Function            PT Goals (current goals can now be found in the care plan section) Progress towards PT goals: Progressing toward goals    Frequency    BID      PT Plan Current plan remains appropriate    Co-evaluation              AM-PAC PT "6 Clicks" Daily Activity  Outcome Measure  Difficulty turning over in bed (including adjusting bedclothes, sheets and blankets)?: None Difficulty moving from lying on back to sitting on the side of the bed? : None Difficulty sitting down on and standing up from a chair with arms (e.g., wheelchair, bedside commode, etc,.)?: None Help needed moving to and from a bed to chair (including a wheelchair)?: A Little Help needed walking in hospital room?: A Little Help needed climbing 3-5 steps with a railing? : A Little 6 Click Score: 21    End of Session Equipment Utilized During Treatment: Gait belt Activity Tolerance: Patient  tolerated treatment well;Other (comment);Treatment limited secondary to medical complications (Comment) Patient left: in bed;with call bell/phone within reach;with bed alarm set;with SCD's reapplied;Other (comment)   Pain - Right/Left: Left Pain - part of body: Knee     Time: 9528-4132 PT Time Calculation (min) (ACUTE ONLY): 12 min  Charges:  $Therapeutic Exercise: 8-22 mins                    G Codes:       [name], PTA 06/13/17, 11:37 AM

## 2017-06-13 NOTE — Progress Notes (Signed)
PT Cancellation Note  Patient Details Name: Heather Colon MRN: [id number] DOB: [birth year]   Cancelled Treatment:    Reason Eval/Treat Not Completed: Patient not medically ready   Attempted session this morning.  HR up to 144 ar rest.  Nursing aware.  Will hold session this am and re-check again before lunch.   [name] 06/13/2017, 9:12 AM

## 2017-06-13 NOTE — Progress Notes (Signed)
[facility] [place] NAME: Heather Colon    MR#:  [id number]  DATE OF BIRTH:  January 24, 1933  SUBJECTIVE:   Patient still With elevated heart rates this morning. She continues to be in atrial fibrillation. Heart rate ranging between 100-150. Patient does not feel that she has any pain however she is anxious.  REVIEW OF SYSTEMS:    Review of Systems  Constitutional: Negative for fever, chills weight loss HENT: Negative for ear pain, nosebleeds, congestion, facial swelling, rhinorrhea, neck pain, neck stiffness and ear discharge.   Respiratory: Negative for cough, shortness of breath, wheezing  Cardiovascular: Negative for chest pain, palpitations and leg swelling.  Gastrointestinal: Negative for heartburn, abdominal pain, vomiting, diarrhea or consitpation Genitourinary: Negative for dysuria, urgency, frequency, hematuria Musculoskeletal: Negative for back pain or joint pain Ankle pain has improved Neurological: Negative for dizziness, seizures, syncope, focal weakness,  numbness and headaches.  Hematological: Does not bruise/bleed easily.  Psychiatric/Behavioral: Negative for hallucinations, confusion, dysphoric mood Positive anxiety   Tolerating Diet: yes      DRUG ALLERGIES:   Allergies  Allergen Reactions  . Actonel [Risedronate Sodium] Swelling    swelling of tongue, no breathing problems  . Codeine Other (See Comments)    dizzy  . Other Other (See Comments)    Pt does not tolerate strong pains very well - makes pt not herslf    VITALS:  Blood pressure (!) 150/74, pulse (!) 133, temperature 97.9 F (36.6 C), temperature source Oral, resp. rate 18, height 5\' 1"  (1.549 m), weight 49.4 kg (108 lb 14.4 oz), SpO2 95 %.  PHYSICAL EXAMINATION:  Constitutional: Appears well-developed and well-nourished. No distress. HENT: Normocephalic. Marland Kitchen Oropharynx is clear and moist.  Eyes: Conjunctivae and EOM are normal. PERRLA, no scleral  icterus.  Neck: Normal ROM. Neck supple. No JVD. No tracheal deviation. CVS: Irregular, irregular tachycardia no murmurs, no gallops, no carotid bruit.  Pulmonary: Effort and breath sounds normal, no stridor, rhonchi, wheezes, rales.  Abdominal: Soft. BS +,  no distension, tenderness, rebound or guarding.  Musculoskeletal: Normal range of motion. No edema and no tenderness.  Neuro: Alert. CN 2-12 grossly intact. No focal deficits. Skin: Skin is warm and dry. No rash noted. Psychiatric: Normal mood and affect.     LABORATORY PANEL:   CBC  Recent Labs Lab 06/13/17 0819  WBC 9.5  HGB 12.0  HCT 34.8*  PLT 192   ------------------------------------------------------------------------------------------------------------------  Chemistries   Recent Labs Lab 06/13/17 0819  NA 136  K 3.7  CL 103  CO2 24  GLUCOSE 96  BUN 21*  CREATININE 0.87  CALCIUM 8.9   ------------------------------------------------------------------------------------------------------------------  Cardiac Enzymes No results for input(s): TROPONINI in the last 168 hours. ------------------------------------------------------------------------------------------------------------------  RADIOLOGY:  No results found.   ASSESSMENT AND PLAN:   84 year old female with a history of chronic atrial fibrillation status post total knee arthroplasty.  1. Atrial fibrillation with RVR: Plan to convert IV to oral diltiazem 60 mg every 6 hours. Continue with verapamil Appreciate cardiology consultation  2. Acute kidney injury due to dehydration/prerenal azotemia: This is improved with IV fluids.   3. Postoperative day #3 total left knee arthroplasty: Patient is management with the surgery  It appears that cardiology is following patient for atrial fibrillation which is her main issue.  Hospital service will sign off. Please call if you have further questions.    Management plans discussed with the  patient and she is in agreement.  CODE STATUS:  full  TOTAL TIME TAKING CARE OF THIS PATIENT: 30 minutes.     POSSIBLE D/C today or tomorrow, DEPENDING ON CLINICAL CONDITION.   Delon Revelo M.D on 06/13/2017 at 9:16 AM  Between 7am to 6pm - Pager - [phone] After 6pm go to [email or web address] - password [email or web address] [facility]  Office  [phone]  CC: Primary care physician; Kirk Ruths, MD  Note: This dictation was prepared with Dragon dictation along with smaller phrase technology. Any transcriptional errors that result from this process are unintentional.

## 2017-06-13 NOTE — Progress Notes (Signed)
   Subjective: 3 Days Post-Op Procedure(s) (LRB): COMPUTER ASSISTED TOTAL KNEE ARTHROPLASTY (Left) Patient reports pain as 0 on 0-10 scale.   Patient is resting well. feeling better. no longer having the sensation that she had yesterday with the chest. Continue with therapy if cleared by medicine/cardiology Plan is to go Home after hospital stay. no nausea and no vomiting Patient denies any chest pains or shortness of breath.   Objective: Vital signs in last 24 hours: Temp:  [97.7 F (36.5 C)-98.5 F (36.9 C)] 97.8 F (36.6 C) (08/23 0505) Pulse Rate:  [84-142] 84 (08/23 0544) Resp:  [14-18] 18 (08/23 0505) BP: (119-187)/(62-96) 119/65 (08/23 0544) SpO2:  [94 %-99 %] 96 % (08/23 0505) Weight:  [49.4 kg (108 lb 14.4 oz)] 49.4 kg (108 lb 14.4 oz) (08/22 1952) well approximated incision Heels are non tender and elevated off the bed using rolled towels Intake/Output from previous day: 08/22 0701 - 08/23 0700 In: -  Out: 1200 [Urine:1200] Intake/Output this shift: No intake/output data recorded.   Recent Labs  06/11/17 0457 06/12/17 0507  HGB 11.2* 11.1*    Recent Labs  06/11/17 0457 06/12/17 0507  WBC 11.7* 8.3  RBC 3.52* 3.49*  HCT 32.4* 32.1*  PLT 177 163    Recent Labs  06/11/17 0457 06/12/17 0507  NA 135 141  K 4.0 3.7  CL 105 110  CO2 23 25  BUN 29* 28*  CREATININE 1.07* 1.32*  GLUCOSE 117* 87  CALCIUM 8.7* 8.9   No results for input(s): LABPT, INR in the last 72 hours.  EXAM General - Patient is Alert, Appropriate and Oriented Extremity - Neurologically intact Neurovascular intact Sensation intact distally Intact pulses distally Dorsiflexion/Plantar flexion intact No cellulitis present Compartment soft Dressing - scant drainage Motor Function - intact, moving foot and toes well on exam.   Lungs clear to auscultation  Elevated HR and irregular.   Past Medical History:  Diagnosis Date  . Arthritis   . Cancer ([place])    Basal Cell Skin  Cancer  . Chronic kidney disease    phase 3  . Coronary artery disease   . Glaucoma    both eyes  . Hypertension   . Hypothyroidism   . Osteoporosis     Assessment/Plan: 3 Days Post-Op Procedure(s) (LRB): COMPUTER ASSISTED TOTAL KNEE ARTHROPLASTY (Left) Active Problems:   S/P total knee arthroplasty  Estimated body mass index is 20.58 kg/m as calculated from the following:   Height as of this encounter: 5\' 1"  (1.549 m).   Weight as of this encounter: 49.4 kg (108 lb 14.4 oz). Discharge home with home health when cleared by medicine and cardiology  Labs: reviewed DVT Prophylaxis - Lovenox, Foot Pumps and TED hose Weight-Bearing as tolerated to left leg D/C O2 and Pulse OX and try on Room Air Appreciate medicine and cardiology help with this pt.  Jillyn Ledger. [facility] 06/13/2017, 7:25 AM

## 2017-11-27 IMAGING — DX DG KNEE 1-2V PORT*L*
2 series · 2 of 2 positions shown · non-contrast
Comparison: None

CLINICAL DATA: Status post left total knee arthroplasty.

EXAM:
PORTABLE LEFT KNEE - 1-2 VIEW

[knee ap]
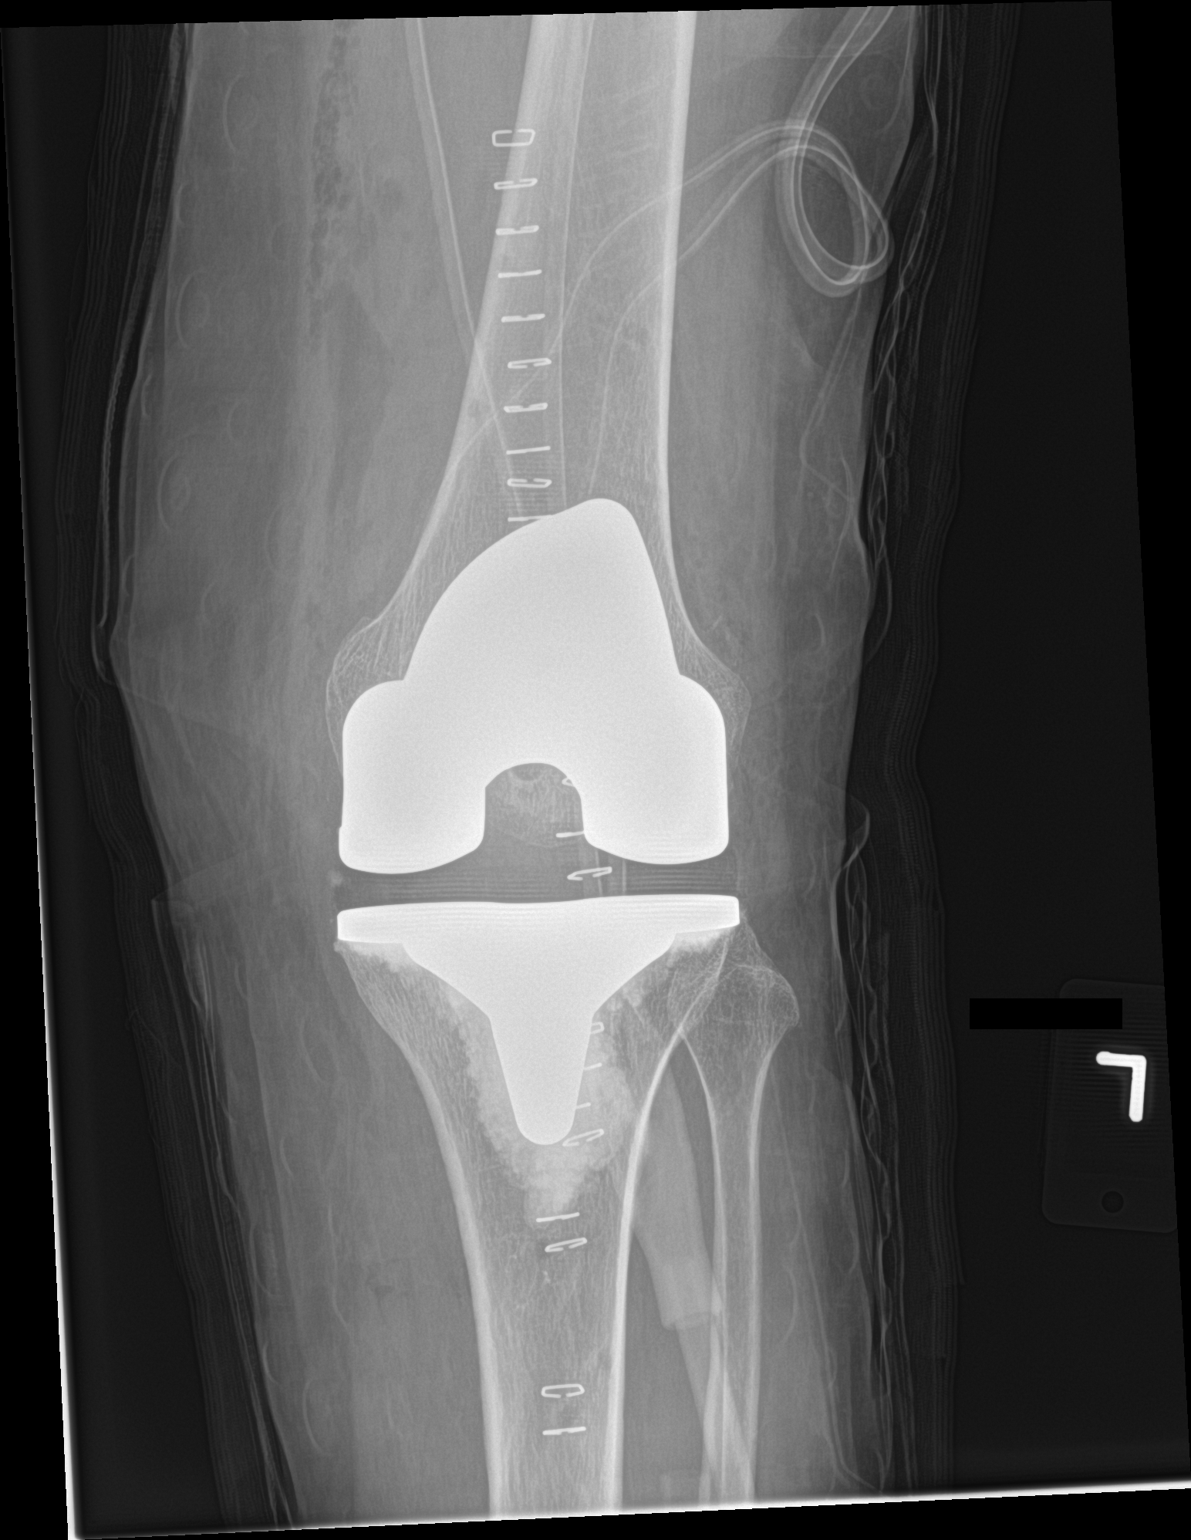

[knee lat]
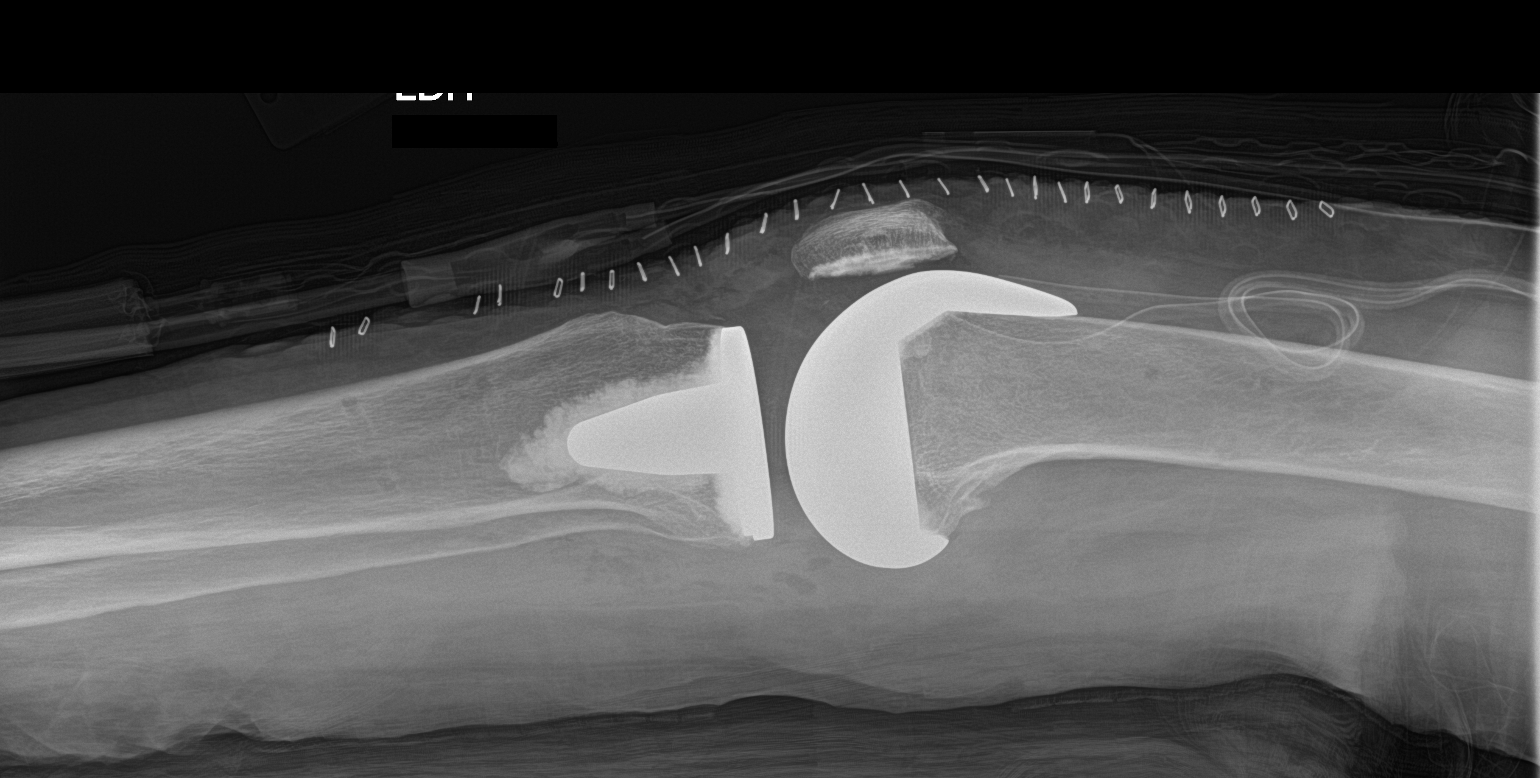

[2 of 2 positions shown; findings below may reference images not displayed]

FINDINGS: Hardware components of a left total knee arthroplasty device
identified. The hardware components are in anatomic alignment. No
periprosthetic fracture identified. Skin staples and surgical drain
identified.
IMPRESSION: 1. Status post left total knee arthroplasty.  No complications.

## 2019-12-18 ENCOUNTER — Ambulatory Visit: Payer: Medicare PPO | Attending: Internal Medicine

## 2019-12-18 DIAGNOSIS — Z23 Encounter for immunization: Secondary | ICD-10-CM | POA: Insufficient documentation

## 2019-12-18 NOTE — Progress Notes (Signed)
   Covid-19 Vaccination Clinic  Name:  Heather Colon    MRN: [id number] DOB: [birth year]  12/18/2019  [name] was observed post Covid-19 immunization for 15 minutes without incidence. She was provided with Vaccine Information Sheet and instruction to access the V-Safe system.   [name] was instructed to call 911 with any severe reactions post vaccine: Marland Kitchen Difficulty breathing  . Swelling of your face and throat  . A fast heartbeat  . A bad rash all over your body  . Dizziness and weakness    Immunizations Administered    Name Date Dose VIS Date Route   Pfizer COVID-19 Vaccine 12/18/2019 12:43 PM 0.3 mL 10/02/2019 Intramuscular   Manufacturer: [place]   Lot: [id number]   Protection: [id number]

## 2020-01-20 ENCOUNTER — Ambulatory Visit: Payer: Medicare PPO | Attending: Internal Medicine

## 2020-01-20 DIAGNOSIS — Z23 Encounter for immunization: Secondary | ICD-10-CM

## 2020-01-20 NOTE — Progress Notes (Signed)
   Covid-19 Vaccination Clinic  Name:  Aubre Marts    MRN: [id number] DOB: 29-Jul-1933  01/20/2020  Ms. Nelms was observed post Covid-19 immunization for 15 minutes without incident. She was provided with Vaccine Information Sheet and instruction to access the V-Safe system.   Ms. [place] was instructed to call 911 with any severe reactions post vaccine: Marland Kitchen Difficulty breathing  . Swelling of face and throat  . A fast heartbeat  . A bad rash all over body  . Dizziness and weakness   Immunizations Administered    Name Date Dose VIS Date Route   Pfizer COVID-19 Vaccine 01/20/2020 11:27 AM 0.3 mL 10/02/2019 Intramuscular   Manufacturer: [place]   Lot: [id number]   [place]: [id number]

## 2023-02-15 ENCOUNTER — Other Ambulatory Visit: Payer: Self-pay

## 2023-02-15 ENCOUNTER — Inpatient Hospital Stay
Admission: EM | Admit: 2023-02-15 | Discharge: 2023-02-20 | DRG: 481 | Disposition: E | Payer: Medicare PPO | Attending: Internal Medicine | Admitting: Internal Medicine

## 2023-02-15 ENCOUNTER — Emergency Department: Payer: Medicare PPO

## 2023-02-15 ENCOUNTER — Inpatient Hospital Stay: Payer: Medicare PPO | Admitting: Certified Registered"

## 2023-02-15 ENCOUNTER — Encounter: Admission: EM | Disposition: E | Payer: Self-pay | Source: Home / Self Care | Attending: Internal Medicine

## 2023-02-15 ENCOUNTER — Inpatient Hospital Stay: Payer: Medicare PPO

## 2023-02-15 DIAGNOSIS — Z7901 Long term (current) use of anticoagulants: Secondary | ICD-10-CM | POA: Diagnosis not present

## 2023-02-15 DIAGNOSIS — Z885 Allergy status to narcotic agent status: Secondary | ICD-10-CM

## 2023-02-15 DIAGNOSIS — S72001A Fracture of unspecified part of neck of right femur, initial encounter for closed fracture: Secondary | ICD-10-CM | POA: Diagnosis not present

## 2023-02-15 DIAGNOSIS — R636 Underweight: Secondary | ICD-10-CM | POA: Diagnosis present

## 2023-02-15 DIAGNOSIS — I468 Cardiac arrest due to other underlying condition: Secondary | ICD-10-CM | POA: Diagnosis not present

## 2023-02-15 DIAGNOSIS — I129 Hypertensive chronic kidney disease with stage 1 through stage 4 chronic kidney disease, or unspecified chronic kidney disease: Secondary | ICD-10-CM | POA: Diagnosis present

## 2023-02-15 DIAGNOSIS — D649 Anemia, unspecified: Secondary | ICD-10-CM | POA: Diagnosis not present

## 2023-02-15 DIAGNOSIS — Y92009 Unspecified place in unspecified non-institutional (private) residence as the place of occurrence of the external cause: Secondary | ICD-10-CM

## 2023-02-15 DIAGNOSIS — D509 Iron deficiency anemia, unspecified: Secondary | ICD-10-CM | POA: Diagnosis present

## 2023-02-15 DIAGNOSIS — R000 Tachycardia, unspecified: Secondary | ICD-10-CM | POA: Diagnosis present

## 2023-02-15 DIAGNOSIS — I2510 Atherosclerotic heart disease of native coronary artery without angina pectoris: Secondary | ICD-10-CM | POA: Diagnosis present

## 2023-02-15 DIAGNOSIS — Z888 Allergy status to other drugs, medicaments and biological substances status: Secondary | ICD-10-CM | POA: Diagnosis not present

## 2023-02-15 DIAGNOSIS — S72001S Fracture of unspecified part of neck of right femur, sequela: Secondary | ICD-10-CM

## 2023-02-15 DIAGNOSIS — I469 Cardiac arrest, cause unspecified: Secondary | ICD-10-CM

## 2023-02-15 DIAGNOSIS — E039 Hypothyroidism, unspecified: Secondary | ICD-10-CM | POA: Diagnosis not present

## 2023-02-15 DIAGNOSIS — I951 Orthostatic hypotension: Secondary | ICD-10-CM | POA: Insufficient documentation

## 2023-02-15 DIAGNOSIS — Z681 Body mass index (BMI) 19 or less, adult: Secondary | ICD-10-CM | POA: Diagnosis not present

## 2023-02-15 DIAGNOSIS — W010XXA Fall on same level from slipping, tripping and stumbling without subsequent striking against object, initial encounter: Secondary | ICD-10-CM | POA: Diagnosis present

## 2023-02-15 DIAGNOSIS — Z79890 Hormone replacement therapy (postmenopausal): Secondary | ICD-10-CM

## 2023-02-15 DIAGNOSIS — I480 Paroxysmal atrial fibrillation: Secondary | ICD-10-CM | POA: Diagnosis present

## 2023-02-15 DIAGNOSIS — W19XXXA Unspecified fall, initial encounter: Secondary | ICD-10-CM

## 2023-02-15 DIAGNOSIS — Z96653 Presence of artificial knee joint, bilateral: Secondary | ICD-10-CM | POA: Diagnosis present

## 2023-02-15 DIAGNOSIS — R64 Cachexia: Secondary | ICD-10-CM | POA: Diagnosis present

## 2023-02-15 DIAGNOSIS — I482 Chronic atrial fibrillation, unspecified: Secondary | ICD-10-CM

## 2023-02-15 DIAGNOSIS — M810 Age-related osteoporosis without current pathological fracture: Secondary | ICD-10-CM | POA: Diagnosis present

## 2023-02-15 DIAGNOSIS — Z79899 Other long term (current) drug therapy: Secondary | ICD-10-CM | POA: Diagnosis not present

## 2023-02-15 DIAGNOSIS — H409 Unspecified glaucoma: Secondary | ICD-10-CM | POA: Diagnosis present

## 2023-02-15 DIAGNOSIS — Z85828 Personal history of other malignant neoplasm of skin: Secondary | ICD-10-CM | POA: Diagnosis not present

## 2023-02-15 DIAGNOSIS — S72011A Unspecified intracapsular fracture of right femur, initial encounter for closed fracture: Principal | ICD-10-CM | POA: Diagnosis present

## 2023-02-15 DIAGNOSIS — F03918 Unspecified dementia, unspecified severity, with other behavioral disturbance: Secondary | ICD-10-CM | POA: Diagnosis not present

## 2023-02-15 DIAGNOSIS — S72009A Fracture of unspecified part of neck of unspecified femur, initial encounter for closed fracture: Secondary | ICD-10-CM | POA: Diagnosis present

## 2023-02-15 DIAGNOSIS — S72001D Fracture of unspecified part of neck of right femur, subsequent encounter for closed fracture with routine healing: Secondary | ICD-10-CM | POA: Diagnosis not present

## 2023-02-15 DIAGNOSIS — N183 Chronic kidney disease, stage 3 unspecified: Secondary | ICD-10-CM | POA: Diagnosis present

## 2023-02-15 HISTORY — PX: PERCUTANEOUS PINNING: SHX2209

## 2023-02-15 LAB — CBC WITH DIFFERENTIAL/PLATELET
Abs Immature Granulocytes: 0.05 10*3/uL (ref 0.00–0.07)
Basophils Absolute: 0 10*3/uL (ref 0.0–0.1)
Basophils Relative: 0 %
Eosinophils Absolute: 0.1 10*3/uL (ref 0.0–0.5)
Eosinophils Relative: 1 %
HCT: 33.9 % — ABNORMAL LOW (ref 36.0–46.0)
Hemoglobin: 10.6 g/dL — ABNORMAL LOW (ref 12.0–15.0)
Immature Granulocytes: 1 %
Lymphocytes Relative: 10 %
Lymphs Abs: 1 10*3/uL (ref 0.7–4.0)
MCH: 25.9 pg — ABNORMAL LOW (ref 26.0–34.0)
MCHC: 31.3 g/dL (ref 30.0–36.0)
MCV: 82.9 fL (ref 80.0–100.0)
Monocytes Absolute: 0.7 10*3/uL (ref 0.1–1.0)
Monocytes Relative: 7 %
Neutro Abs: 8 10*3/uL — ABNORMAL HIGH (ref 1.7–7.7)
Neutrophils Relative %: 81 %
Platelets: 220 10*3/uL (ref 150–400)
RBC: 4.09 MIL/uL (ref 3.87–5.11)
RDW: 15.3 % (ref 11.5–15.5)
WBC: 9.9 10*3/uL (ref 4.0–10.5)
nRBC: 0 % (ref 0.0–0.2)

## 2023-02-15 LAB — MRSA NEXT GEN BY PCR, NASAL: MRSA by PCR Next Gen: NOT DETECTED

## 2023-02-15 LAB — PROTIME-INR
INR: 1.6 — ABNORMAL HIGH (ref 0.8–1.2)
Prothrombin Time: 19.1 seconds — ABNORMAL HIGH (ref 11.4–15.2)

## 2023-02-15 LAB — TYPE AND SCREEN
ABO/RH(D): A POS
Antibody Screen: NEGATIVE

## 2023-02-15 LAB — BASIC METABOLIC PANEL
Anion gap: 11 (ref 5–15)
BUN: 24 mg/dL — ABNORMAL HIGH (ref 8–23)
CO2: 24 mmol/L (ref 22–32)
Calcium: 9.3 mg/dL (ref 8.9–10.3)
Chloride: 101 mmol/L (ref 98–111)
Creatinine, Ser: 1.02 mg/dL — ABNORMAL HIGH (ref 0.44–1.00)
GFR, Estimated: 53 mL/min — ABNORMAL LOW (ref >60)
Glucose, Bld: 108 mg/dL — ABNORMAL HIGH (ref 70–99)
Potassium: 4.1 mmol/L (ref 3.5–5.1)
Sodium: 136 mmol/L (ref 135–145)

## 2023-02-15 SURGERY — PINNING, EXTREMITY, PERCUTANEOUS
Anesthesia: General | Laterality: Right

## 2023-02-15 MED ORDER — HYDRALAZINE HCL 20 MG/ML IJ SOLN
10.0000 mg | INTRAMUSCULAR | Status: DC | PRN
  Administered 2023-02-15 – 2023-02-16 (×3): 10 mg via INTRAVENOUS
  Filled 2023-02-15 (×3): qty 1

## 2023-02-15 MED ORDER — LEVOTHYROXINE SODIUM 50 MCG PO TABS
125.0000 ug | ORAL_TABLET | Freq: Every day | ORAL | Status: DC
  Administered 2023-02-16 – 2023-02-17 (×2): 125 ug via ORAL
  Filled 2023-02-15 (×3): qty 1

## 2023-02-15 MED ORDER — SODIUM CHLORIDE 0.9 % IV SOLN: INTRAVENOUS | Status: DC

## 2023-02-15 MED ORDER — METOCLOPRAMIDE HCL 5 MG PO TABS: 5.0000 mg | ORAL_TABLET | Freq: Three times a day (TID) | ORAL | Status: DC | PRN

## 2023-02-15 MED ORDER — DEXAMETHASONE SODIUM PHOSPHATE 10 MG/ML IJ SOLN
INTRAMUSCULAR | Status: DC | PRN
  Administered 2023-02-15: 5 mg via INTRAVENOUS

## 2023-02-15 MED ORDER — MAGNESIUM HYDROXIDE 400 MG/5ML PO SUSP: 30.0000 mL | Freq: Every day | ORAL | Status: DC | PRN

## 2023-02-15 MED ORDER — FENTANYL CITRATE (PF) 100 MCG/2ML IJ SOLN
INTRAMUSCULAR | Status: DC | PRN
  Administered 2023-02-15 (×2): 25 ug via INTRAVENOUS

## 2023-02-15 MED ORDER — ADULT MULTIVITAMIN W/MINERALS CH
1.0000 | ORAL_TABLET | Freq: Every day | ORAL | Status: DC
  Administered 2023-02-16 – 2023-02-17 (×2): 1 via ORAL
  Filled 2023-02-15 (×2): qty 1

## 2023-02-15 MED ORDER — EPINEPHRINE PF 1 MG/ML IJ SOLN
INTRAMUSCULAR | Status: AC
  Filled 2023-02-15: qty 1

## 2023-02-15 MED ORDER — OXYCODONE HCL 5 MG PO TABS
5.0000 mg | ORAL_TABLET | Freq: Once | ORAL | Status: AC | PRN
  Administered 2023-02-15: 5 mg via ORAL

## 2023-02-15 MED ORDER — PHENYLEPHRINE HCL (PRESSORS) 10 MG/ML IV SOLN
INTRAVENOUS | Status: DC | PRN
  Administered 2023-02-15: 80 ug via INTRAVENOUS
  Administered 2023-02-15: 160 ug via INTRAVENOUS

## 2023-02-15 MED ORDER — FENTANYL CITRATE (PF) 100 MCG/2ML IJ SOLN: 25.0000 ug | INTRAMUSCULAR | Status: DC | PRN

## 2023-02-15 MED ORDER — ACETAMINOPHEN 10 MG/ML IV SOLN
INTRAVENOUS | Status: AC
  Filled 2023-02-15: qty 100

## 2023-02-15 MED ORDER — LIDOCAINE HCL (CARDIAC) PF 100 MG/5ML IV SOSY
PREFILLED_SYRINGE | INTRAVENOUS | Status: DC | PRN
  Administered 2023-02-15: 80 mg via INTRAVENOUS

## 2023-02-15 MED ORDER — FENTANYL CITRATE PF 50 MCG/ML IJ SOSY
50.0000 ug | PREFILLED_SYRINGE | INTRAMUSCULAR | Status: DC | PRN
  Administered 2023-02-15: 50 ug via INTRAVENOUS
  Filled 2023-02-15: qty 1

## 2023-02-15 MED ORDER — DOCUSATE SODIUM 100 MG PO CAPS
100.0000 mg | ORAL_CAPSULE | Freq: Two times a day (BID) | ORAL | Status: DC
  Administered 2023-02-16 – 2023-02-17 (×4): 100 mg via ORAL
  Filled 2023-02-15 (×5): qty 1

## 2023-02-15 MED ORDER — CEFAZOLIN SODIUM-DEXTROSE 2-4 GM/100ML-% IV SOLN
2.0000 g | INTRAVENOUS | Status: AC
  Administered 2023-02-15: 2 g via INTRAVENOUS

## 2023-02-15 MED ORDER — BISACODYL 10 MG RE SUPP: 10.0000 mg | Freq: Every day | RECTAL | Status: DC | PRN

## 2023-02-15 MED ORDER — VERAPAMIL HCL ER 240 MG PO TBCR
240.0000 mg | EXTENDED_RELEASE_TABLET | Freq: Every day | ORAL | Status: DC
  Administered 2023-02-16: 240 mg via ORAL
  Filled 2023-02-15 (×2): qty 1

## 2023-02-15 MED ORDER — FLEET ENEMA 7-19 GM/118ML RE ENEM: 1.0000 | ENEMA | Freq: Once | RECTAL | Status: DC | PRN

## 2023-02-15 MED ORDER — HYDROCODONE-ACETAMINOPHEN 5-325 MG PO TABS: 1.0000 | ORAL_TABLET | ORAL | Status: DC | PRN

## 2023-02-15 MED ORDER — OXYCODONE HCL 5 MG/5ML PO SOLN: 5.0000 mg | Freq: Once | ORAL | Status: AC | PRN

## 2023-02-15 MED ORDER — SUGAMMADEX SODIUM 200 MG/2ML IV SOLN
INTRAVENOUS | Status: DC | PRN
  Administered 2023-02-15: 150 mg via INTRAVENOUS

## 2023-02-15 MED ORDER — ONDANSETRON HCL 4 MG/2ML IJ SOLN
INTRAMUSCULAR | Status: DC | PRN
  Administered 2023-02-15: 4 mg via INTRAVENOUS

## 2023-02-15 MED ORDER — ONDANSETRON HCL 4 MG/2ML IJ SOLN: 4.0000 mg | Freq: Four times a day (QID) | INTRAMUSCULAR | Status: DC | PRN

## 2023-02-15 MED ORDER — ACETAMINOPHEN 10 MG/ML IV SOLN
INTRAVENOUS | Status: DC | PRN
  Administered 2023-02-15: 1000 mg via INTRAVENOUS

## 2023-02-15 MED ORDER — LACTATED RINGERS IV SOLN: INTRAVENOUS | Status: DC | PRN

## 2023-02-15 MED ORDER — ACETAMINOPHEN 325 MG PO TABS
325.0000 mg | ORAL_TABLET | Freq: Four times a day (QID) | ORAL | Status: DC | PRN
  Administered 2023-02-16 – 2023-02-17 (×2): 650 mg via ORAL
  Filled 2023-02-15 (×2): qty 2

## 2023-02-15 MED ORDER — BUPIVACAINE HCL (PF) 0.5 % IJ SOLN
INTRAMUSCULAR | Status: AC
  Filled 2023-02-15: qty 30

## 2023-02-15 MED ORDER — ENOXAPARIN SODIUM 30 MG/0.3ML IJ SOSY: 30.0000 mg | PREFILLED_SYRINGE | INTRAMUSCULAR | Status: DC

## 2023-02-15 MED ORDER — OXYCODONE HCL 5 MG PO TABS
ORAL_TABLET | ORAL | Status: AC
  Filled 2023-02-15: qty 1

## 2023-02-15 MED ORDER — ONDANSETRON HCL 4 MG PO TABS: 4.0000 mg | ORAL_TABLET | Freq: Four times a day (QID) | ORAL | Status: DC | PRN

## 2023-02-15 MED ORDER — FENTANYL CITRATE (PF) 100 MCG/2ML IJ SOLN
INTRAMUSCULAR | Status: AC
  Filled 2023-02-15: qty 2

## 2023-02-15 MED ORDER — DIPHENHYDRAMINE HCL 12.5 MG/5ML PO ELIX: 12.5000 mg | ORAL_SOLUTION | ORAL | Status: DC | PRN

## 2023-02-15 MED ORDER — CEFAZOLIN SODIUM-DEXTROSE 2-4 GM/100ML-% IV SOLN
2.0000 g | Freq: Three times a day (TID) | INTRAVENOUS | Status: AC
  Administered 2023-02-16 (×2): 2 g via INTRAVENOUS
  Filled 2023-02-15 (×2): qty 100

## 2023-02-15 MED ORDER — ACETAMINOPHEN 500 MG PO TABS
500.0000 mg | ORAL_TABLET | Freq: Four times a day (QID) | ORAL | Status: DC
  Administered 2023-02-15 – 2023-02-16 (×3): 500 mg via ORAL
  Filled 2023-02-15 (×3): qty 1

## 2023-02-15 MED ORDER — METOCLOPRAMIDE HCL 5 MG/ML IJ SOLN: 5.0000 mg | Freq: Three times a day (TID) | INTRAMUSCULAR | Status: DC | PRN

## 2023-02-15 MED ORDER — PROPOFOL 10 MG/ML IV BOLUS
INTRAVENOUS | Status: DC | PRN
  Administered 2023-02-15: 100 mg via INTRAVENOUS

## 2023-02-15 MED ORDER — APIXABAN 2.5 MG PO TABS
2.5000 mg | ORAL_TABLET | Freq: Two times a day (BID) | ORAL | Status: DC
  Administered 2023-02-16 – 2023-02-17 (×4): 2.5 mg via ORAL
  Filled 2023-02-15 (×4): qty 1

## 2023-02-15 MED ORDER — TRAZODONE HCL 50 MG PO TABS
50.0000 mg | ORAL_TABLET | Freq: Every day | ORAL | Status: DC
  Administered 2023-02-16 – 2023-02-17 (×2): 50 mg via ORAL
  Filled 2023-02-15 (×3): qty 1

## 2023-02-15 MED ORDER — HYDROMORPHONE HCL 1 MG/ML IJ SOLN: 0.5000 mg | INTRAMUSCULAR | Status: DC | PRN

## 2023-02-15 MED ORDER — ROCURONIUM BROMIDE 100 MG/10ML IV SOLN
INTRAVENOUS | Status: DC | PRN
  Administered 2023-02-15: 40 mg via INTRAVENOUS

## 2023-02-15 MED ORDER — CEFAZOLIN SODIUM-DEXTROSE 2-4 GM/100ML-% IV SOLN
INTRAVENOUS | Status: AC
  Filled 2023-02-15: qty 100

## 2023-02-15 MED ORDER — BUPIVACAINE-EPINEPHRINE (PF) 0.5% -1:200000 IJ SOLN
INTRAMUSCULAR | Status: DC | PRN
  Administered 2023-02-15: 20 mL

## 2023-02-15 MED ORDER — PROPOFOL 10 MG/ML IV BOLUS
INTRAVENOUS | Status: AC
  Filled 2023-02-15: qty 20

## 2023-02-15 SURGICAL SUPPLY — 43 items
APL PRP STRL LF DISP 70% ISPRP (MISCELLANEOUS) ×2
BIT DRILL CANNULATED 5.0 (BIT) IMPLANT
BNDG CMPR 5X4 CHSV STRCH STRL (GAUZE/BANDAGES/DRESSINGS) ×1
BNDG CMPR 5X6 CHSV STRCH STRL (GAUZE/BANDAGES/DRESSINGS) ×1
BNDG COHESIVE 4X5 TAN STRL LF (GAUZE/BANDAGES/DRESSINGS) ×1 IMPLANT
BNDG COHESIVE 6X5 TAN ST LF (GAUZE/BANDAGES/DRESSINGS) ×1 IMPLANT
CHLORAPREP W/TINT 26 (MISCELLANEOUS) ×2 IMPLANT
DRSG OPSITE POSTOP 3X4 (GAUZE/BANDAGES/DRESSINGS) ×1 IMPLANT
ELECT REM PT RETURN 9FT ADLT (ELECTROSURGICAL) ×1
ELECTRODE REM PT RTRN 9FT ADLT (ELECTROSURGICAL) ×1 IMPLANT
GAUZE XEROFORM 1X8 LF (GAUZE/BANDAGES/DRESSINGS) IMPLANT
GLOVE BIO SURGEON STRL SZ8 (GLOVE) ×2 IMPLANT
GLOVE INDICATOR 8.0 STRL GRN (GLOVE) ×1 IMPLANT
GOWN STRL REUS W/ TWL LRG LVL3 (GOWN DISPOSABLE) ×1 IMPLANT
GOWN STRL REUS W/ TWL XL LVL3 (GOWN DISPOSABLE) ×1 IMPLANT
GOWN STRL REUS W/TWL LRG LVL3 (GOWN DISPOSABLE) ×1
GOWN STRL REUS W/TWL XL LVL3 (GOWN DISPOSABLE) ×1
GUIDE PIN 3.2MM 5PK (PIN) ×1
HANDLE YANKAUER SUCT OPEN TIP (MISCELLANEOUS) ×1 IMPLANT
HOLDER FOLEY CATH W/STRAP (MISCELLANEOUS) IMPLANT
MANIFOLD NEPTUNE II (INSTRUMENTS) ×1 IMPLANT
NDL FILTER BLUNT 18X1 1/2 (NEEDLE) ×1 IMPLANT
NDL HYPO 22X1.5 SAFETY MO (MISCELLANEOUS) ×1 IMPLANT
NEEDLE FILTER BLUNT 18X1 1/2 (NEEDLE) ×1 IMPLANT
NEEDLE HYPO 22X1.5 SAFETY MO (MISCELLANEOUS) ×1 IMPLANT
PACK HIP COMPR (MISCELLANEOUS) ×1 IMPLANT
PIN GUIDE 3.2MM 5PK (PIN) IMPLANT
PIN THD TIP GUIDE 3.2X12 (PIN) IMPLANT
SCREW CANN 16MM THD 90X7.0 (Screw) IMPLANT
SCREW CANNULATED 7.0X80 (Screw) IMPLANT
STAPLER SKIN PROX 35W (STAPLE) ×1 IMPLANT
STRAP SAFETY 5IN WIDE (MISCELLANEOUS) ×1 IMPLANT
SUT PROLENE 2 0 FS (SUTURE) IMPLANT
SUT VIC AB 0 CT1 36 (SUTURE) ×1 IMPLANT
SUT VIC AB 0 SH 27 (SUTURE) IMPLANT
SUT VIC AB 2-0 CT1 27 (SUTURE) ×1
SUT VIC AB 2-0 CT1 TAPERPNT 27 (SUTURE) ×1 IMPLANT
SYR 20ML LL LF (SYRINGE) ×1 IMPLANT
SYR 5ML LL (SYRINGE) ×1 IMPLANT
TRAP FLUID SMOKE EVACUATOR (MISCELLANEOUS) ×1 IMPLANT
TRAY FOLEY MTR SLVR 16FR STAT (SET/KITS/TRAYS/PACK) IMPLANT
WASHER 5.5MM STAINLESS STEEL (Washer) IMPLANT
WATER STERILE IRR 500ML POUR (IV SOLUTION) ×1 IMPLANT

## 2023-02-15 NOTE — Anesthesia Preprocedure Evaluation (Addendum)
Anesthesia Evaluation  Patient identified by MRN, date of birth, ID band Patient confused    Reviewed: Allergy & Precautions, NPO status , Patient's Chart, lab work & pertinent test results  History of Anesthesia Complications Negative for: history of anesthetic complications  Airway Mallampati: III  TM Distance: <3 FB Neck ROM: full    Dental  (+) Chipped, Poor Dentition, Missing   Pulmonary neg pulmonary ROS, neg shortness of breath    + decreased breath sounds      Cardiovascular Exercise Tolerance: Good hypertension, (-) angina + CAD  + dysrhythmias Atrial Fibrillation  Rate:Tachycardia     Neuro/Psych       Dementia negative neurological ROS     GI/Hepatic negative GI ROS, Neg liver ROS,,,  Endo/Other  Hypothyroidism    Renal/GU Renal disease     Musculoskeletal  (+) Arthritis ,    Abdominal   Peds  Hematology negative hematology ROS (+)   Anesthesia Other Findings Past Medical History: No date: Arthritis No date: Cancer (HCC)     Comment:  Basal Cell Skin Cancer No date: Chronic kidney disease     Comment:  phase 3 No date: Coronary artery disease No date: Glaucoma     Comment:  both eyes No date: Hypertension No date: Hypothyroidism No date: Osteoporosis  Past Surgical History: No date: ABDOMINAL HYSTERECTOMY 2010: CATARACT EXTRACTION W/ INTRAOCULAR LENS IMPLANT; Left 2010: CATARACT EXTRACTION W/ INTRAOCULAR LENS IMPLANT; Right No date: EYE SURGERY; Bilateral     Comment:  Cataract Extraction with IOL 06/18/2016: KNEE ARTHROPLASTY; Right     Comment:  Procedure: COMPUTER ASSISTED TOTAL KNEE ARTHROPLASTY;                Surgeon: Donato Heinz, MD;  Location: [facility] ORS;                Service: Orthopedics;  Laterality: Right; 06/10/2017: KNEE ARTHROPLASTY; Left     Comment:  Procedure: COMPUTER ASSISTED TOTAL KNEE ARTHROPLASTY;                Surgeon: Donato Heinz, MD;  Location: [facility] ORS;                 Service: Orthopedics;  Laterality: Left; 2017: MOUTH SURGERY 1970's: THYROID SURGERY; Bilateral     Reproductive/Obstetrics negative OB ROS                             Anesthesia Physical Anesthesia Plan  ASA: 3  Anesthesia Plan: General ETT   Post-op Pain Management:    Induction: Intravenous  PONV Risk Score and Plan: Ondansetron, Dexamethasone, Midazolam and Treatment may vary due to age or medical condition  Airway Management Planned: Oral ETT  Additional Equipment:   Intra-op Plan:   Post-operative Plan: Extubation in OR  Informed Consent: I have reviewed the patients History and Physical, chart, labs and discussed the procedure including the risks, benefits and alternatives for the proposed anesthesia with the patient or authorized representative who has indicated his/her understanding and acceptance.     Dental Advisory Given  Plan Discussed with: Anesthesiologist, CRNA and Surgeon  Anesthesia Plan Comments: ( Son consented for risks of anesthesia including but not limited to:  - adverse reactions to medications - damage to eyes, teeth, lips or other oral mucosa - nerve damage due to positioning  - sore throat or hoarseness - Damage to heart, brain, nerves, lungs, other parts of body or loss  of life  He voiced understanding.)       Anesthesia Quick Evaluation

## 2023-02-15 NOTE — Op Note (Signed)
02/14/2023  5:02 PM  Patient:   Heather Colon  Pre-Op Diagnosis:   Valgus-impacted right femoral neck fracture.  Post-Op Diagnosis:   Same.  Procedure:   In situ cannulated screw fixation of valgus-impacted right femoral neck fracture.  Surgeon:   Maryagnes Amos, MD  Assistant:   None  Anesthesia:   GET  Findings:   As above.  Complications:   None  EBL:   10 cc  Fluids:   800 cc crystalloid  UOP:   350 cc  TT:   None  Drains:   None  Closure:   Staples  Implants:   Biomet 7.0 mm cannulated screws (16 mm) 3  Brief Clinical Note:   The patient is an 89 year old female who sustained the above-noted injury last night after she tripped and fell while getting up to go the bathroom. She was unable to bear weight this morning, so she was brought to the emergency room where x-rays demonstrated the above-noted findings. The patient has been cleared medically and presents at this time for definitive management of this injury.  Procedure:   The patient was brought into the operating room and lain in the supine position. After adequate general endotracheal intubation and anesthesia were obtained, the patient was repositioned on the fracture table. The uninjured leg was placed in a flexed and abducted position over the well-leg holder while the operative leg was placed in gentle longitudinal traction with some internal rotation. The adequacy of fracture position was verified fluoroscopically in AP and lateral projections before the lateral aspect of the right hip and thigh were prepped with ChloraPrep solution and draped sterilely. Preoperative antibiotics were administered. A timeout was performed to verify the appropriate surgical site.   An approximately 3-4 cm incision was made over the lateral aspect of the lower part of the greater trochanter as verified fluoroscopically. This incision was carried down through the subcutaneous cutaneous tissues to expose the iliotibial band. This  was split the length the incision to expose the lateral aspect of the proximal femur at the level of the inferior most part of the greater trochanter. Under fluoroscopic guidance, a guide wire was drilled up through the femoral neck along the calcar into the femoral head to rest within 5 mm of subchondral bone. Its position was assessed fluoroscopically in AP and lateral projections and found to be excellent. Two additional guide wires were placed in parallel fashion more proximally, anterior and posterior to the original pin in an inverted triangular configuration. Again the position of these pins was verified fluoroscopically in AP, lateral, and oblique projections and found to be excellent. The length of each of these pins was measured before each pin was overdrilled with the appropriate cannulated drill. Each of these screws was inserted and advanced to within 8-10 mm of subchondral bone. Washers were placed on the posterior and inferior two screws. Again the position of each of these screws was assessed fluoroscopically in AP, lateral, and oblique projections, and found to be excellent.  The wound was copiously irrigated with sterile saline solution before the IT band was reapproximated using #0 Vicryl interrupted sutures. The subcutaneous tissues also were closed using 2-0 Vicryl interrupted sutures before the skin was closed using staples. A total of 20 cc of 0.5% Sensorcaine with epinephrine was injected in and around the surgical incision before a sterile occlusive dressing was applied to the wound. The patient was awakened, extubated, and transferred back to her hospital bed. The patient then was returned to  the recovery room in satisfactory condition after tolerating the procedure well.

## 2023-02-15 NOTE — Transfer of Care (Signed)
Immediate Anesthesia Transfer of Care Note  Patient: Heather Colon  Procedure(s) Performed: CANNULATED HIP PINNING (Right)  Patient Location: PACU  Anesthesia Type:General  Level of Consciousness: drowsy and patient cooperative  Airway & Oxygen Therapy: Patient Spontanous Breathing and Patient connected to face mask oxygen  Post-op Assessment: Report given to RN and Post -op Vital signs reviewed and stable  Post vital signs: Reviewed and stable  Last Vitals:  Vitals Value Taken Time  BP 130/105 02/04/2023 1705  Temp    Pulse 102 01/28/2023 1708  Resp 15 02/19/2023 1708  SpO2 99 % 01/21/2023 1708  Vitals shown include unvalidated device data.  Last Pain:  Vitals:   02/02/2023 1446  TempSrc: Oral  PainSc: 4          Complications: No notable events documented.

## 2023-02-15 NOTE — Assessment & Plan Note (Signed)
Cont synthroid pending OR

## 2023-02-15 NOTE — Anesthesia Procedure Notes (Signed)
Procedure Name: Intubation Date/Time: 02/04/2023 3:55 PM  Performed by: Ginger Carne, CRNAPre-anesthesia Checklist: Patient identified, Emergency Drugs available, Suction available, Patient being monitored and Timeout performed Patient Re-evaluated:Patient Re-evaluated prior to induction Oxygen Delivery Method: Circle system utilized Preoxygenation: Pre-oxygenation with 100% oxygen Induction Type: IV induction Ventilation: Mask ventilation without difficulty Laryngoscope Size: McGraph and 3 Grade View: Grade I Tube type: Oral Tube size: 6.5 mm Number of attempts: 1 Airway Equipment and Method: Stylet and Video-laryngoscopy Placement Confirmation: ETT inserted through vocal cords under direct vision, positive ETCO2 and breath sounds checked- equal and bilateral Secured at: 18 cm Tube secured with: Tape Dental Injury: Teeth and Oropharynx as per pre-operative assessment

## 2023-02-15 NOTE — Anesthesia Postprocedure Evaluation (Signed)
Anesthesia Post Note  Patient: Heather Colon  Procedure(s) Performed: CANNULATED HIP PINNING (Right)  Patient location during evaluation: PACU Anesthesia Type: General Level of consciousness: awake and alert Pain management: pain level controlled Vital Signs Assessment: post-procedure vital signs reviewed and stable Respiratory status: spontaneous breathing, nonlabored ventilation, respiratory function stable and patient connected to nasal cannula oxygen Cardiovascular status: blood pressure returned to baseline and stable Postop Assessment: no apparent nausea or vomiting Anesthetic complications: no   No notable events documented.   Last Vitals:  Vitals:   02/14/2023 1758 02/17/2023 2026  BP: (!) 161/86 119/67  Pulse: (!) 105 (!) 106  Resp: 18 18  Temp: 36.4 C 36.7 C  SpO2: 96% 99%    Last Pain:  Vitals:   02/01/2023 1820  TempSrc:   PainSc: Asleep                 Cleda Mccreedy Gizella [place]

## 2023-02-15 NOTE — ED Notes (Signed)
Per Surgeon, OR around 1500 today.

## 2023-02-15 NOTE — ED Provider Notes (Signed)
[facility] Provider Note    Event Date/Time   First MD Initiated Contact with Patient 01/22/2023 1102     (approximate)   History   Fall   HPI  Heather Colon is a 89 y.o. female with history of dementia who had a fall yesterday coming back from the bathroom per daughter-in-law.  Patient fell onto her right side.  She was able to get up and use her walker to get back to bed.  This morning however she was resistant to moving at all so brought to the emergency department.  She is on Eliquis     Physical Exam   Triage Vital Signs: ED Triage Vitals [02/07/2023 1008]  Enc Vitals Group     BP (!) 173/65     Pulse Rate (!) 106     Resp 18     Temp 98.2 F (36.8 C)     Temp Source Oral     SpO2 98 %     Weight      Height      Head Circumference      Peak Flow      Pain Score      Pain Loc      Pain Edu?      Excl. in GC?     Most recent vital signs: Vitals:   02/11/2023 1008 02/13/2023 1139  BP: (!) 173/65 (!) 189/81  Pulse: (!) 106 (!) 106  Resp: 18 18  Temp: 98.2 F (36.8 C)   SpO2: 98% 95%     General: Awake, no distress.  CV:  Good peripheral perfusion.  Resp:  Normal effort.  Abd:  No distention.  Other:  Shortening of the right leg, pain with any movement, warm and well-perfused distally possibly some mild lumbar vertebral tenderness to palpation, otherwise reassuring exam   ED Results / Procedures / Treatments   Labs (all labs ordered are listed, but only abnormal results are displayed) Labs Reviewed  BASIC METABOLIC PANEL - Abnormal; Notable for the following components:      Result Value   Glucose, Bld 108 (*)    BUN 24 (*)    Creatinine, Ser 1.02 (*)    GFR, Estimated 53 (*)    All other components within normal limits  CBC WITH DIFFERENTIAL/PLATELET - Abnormal; Notable for the following components:   Hemoglobin 10.6 (*)    HCT 33.9 (*)    MCH 25.9 (*)    Neutro Abs 8.0 (*)    All other components within normal limits   PROTIME-INR - Abnormal; Notable for the following components:   Prothrombin Time 19.1 (*)    INR 1.6 (*)    All other components within normal limits  TYPE AND SCREEN     EKG  ED ECG REPORT I, Jene Every, the attending physician, personally viewed and interpreted this ECG.  Date: 02/08/2023  Rhythm: normal sinus rhythm QRS Axis: normal Intervals: normal ST/T Wave abnormalities: normal Narrative Interpretation: no evidence of acute ischemia    RADIOLOGY Hip x-ray viewed interpreted by me, right subcapital hip fracture    PROCEDURES:  Critical Care performed:   Procedures   MEDICATIONS ORDERED IN ED: Medications  fentaNYL (SUBLIMAZE) injection 50 mcg (50 mcg Intravenous Given 01/27/2023 1143)  ceFAZolin (ANCEF) IVPB 2g/100 mL premix (has no administration in time range)  HYDROmorphone (DILAUDID) injection 0.5 mg (has no administration in time range)     IMPRESSION / MDM / ASSESSMENT AND PLAN / ED COURSE  I reviewed the triage vital signs and the nursing notes. Patient's presentation is most consistent with acute presentation with potential threat to life or bodily function.  Patient presents with a fall last night, inability to walk today.  Typically ambulates with a walker.  Differential includes hip fracture, contusion, pelvic fracture  X-ray consistent with right-sided subcapital hip fracture, currently pain is well-controlled, IV fentanyl as needed, lab work reviewed and is overall reassuring, will discuss with the hospitalist patient is on Eliquis.  Discussed with Dr. Joice Lofts of orthopedics, he will consult on the patient consider surgery today    Clinical Course as of 01/26/2023 1241  Fri Feb 15, 2023  1207 DG Hip Unilat W or Wo Pelvis 2-3 Views Right [RK]    Clinical Course User Index [RK] Jene Every, MD     FINAL CLINICAL IMPRESSION(S) / ED DIAGNOSES   Final diagnoses:  Closed fracture of right hip, initial encounter [facility])  Fall, initial  encounter     Rx / DC Orders   ED Discharge Orders     None        Note:  This document was prepared using Dragon voice recognition software and may include unintentional dictation errors.   Jene Every, MD 02/16/2023 [phone]

## 2023-02-15 NOTE — Assessment & Plan Note (Signed)
Mechanical fall at home secondary Impacted subcapital right femoral neck fracture on imaging Dr. Joice Lofts with orthopedics consulted for evaluation Coordinating operative repair in setting of atrial fibrillation with Eliquis use today Pain control Follow-up orthopedic surgery recommendations

## 2023-02-15 NOTE — ED Triage Notes (Signed)
Pt to ED via [facility] from home. Pt had a fall last night after walking without her walker and was walking after. Pt reports right hip pain. Pain upon palpation and shorting noted. Pain 8/10. Pt is on blood thinners. Pt hx of dementia. Family reports no LOC or head trauma.   EMS VS:  BP 176/69 HR 107 94% RA CBG 125 Temp 97

## 2023-02-15 NOTE — Assessment & Plan Note (Signed)
Mechanical fall at home in the setting of dementia Noted secondary right hip fracture Pending or evaluation PT OT evaluation Fall precautions Anticipate rehab Follow

## 2023-02-15 NOTE — Assessment & Plan Note (Signed)
Heart rate in low 100s with sinus tach on EKG Hold Eliquis Titrate regimen pending operative evaluation As needed IV metoprolol for heart rate greater than 110 as BP allows

## 2023-02-15 NOTE — H&P (Addendum)
History and Physical    Patient: Heather Colon:[id number] DOB: 02-Apr-1933 DOA: 02/14/2023 DOS: the patient was seen and examined on 02/11/2023 PCP: Lauro Regulus, MD  Patient coming from: Home  Chief Complaint:  Chief Complaint  Patient presents with   Fall   HPI: Heather Colon is a 89 y.o. female with medical history significant of dementia, atrial fibrillation on Eliquis, hypertension, hypothyroidism, coronary artery disease, stage III CKD presenting with fall, right hip fracture.  History primarily from patient's daughter-in-law in the setting of dementia.  Per report, patient had a fall yesterday coming back from the bathroom.  Patient fell onto the right side.  No reported head trauma loss conscious.  Patient was able to get back up with a walker and go back to bed.  Patient woke up this morning with significant pain and inability to ambulate.  No reported weakness or dizziness prior to fall.  No fevers or chills.  No nausea or vomiting.  No chest pain or shortness of breath.  No abdominal pain.  On Eliquis.  No recent medication changes reported. Presented to the ER afebrile, heart rate in the low 100s, BP stable.  Satting well on room air.  White count 9.9, hemoglobin 10.6, creatinine 1.02.  Hip plain films with impacted subcapital right femoral neck fracture and remote left obturator ring fractures.  Plain films of the L-spine and chest x-ray grossly stable. Review of Systems: As mentioned in the history of present illness. All other systems reviewed and are negative. Past Medical History:  Diagnosis Date   Arthritis    Cancer (HCC)    Basal Cell Skin Cancer   Chronic kidney disease    phase 3   Coronary artery disease    Glaucoma    both eyes   Hypertension    Hypothyroidism    Osteoporosis    Past Surgical History:  Procedure Laterality Date   ABDOMINAL HYSTERECTOMY     CATARACT EXTRACTION W/ INTRAOCULAR LENS IMPLANT Left 2010   CATARACT EXTRACTION W/ INTRAOCULAR  LENS IMPLANT Right 2010   EYE SURGERY Bilateral    Cataract Extraction with IOL   KNEE ARTHROPLASTY Right 06/18/2016   Procedure: COMPUTER ASSISTED TOTAL KNEE ARTHROPLASTY;  Surgeon: Donato Heinz, MD;  Location: [facility] ORS;  Service: Orthopedics;  Laterality: Right;   KNEE ARTHROPLASTY Left 06/10/2017   Procedure: COMPUTER ASSISTED TOTAL KNEE ARTHROPLASTY;  Surgeon: Donato Heinz, MD;  Location: [facility] ORS;  Service: Orthopedics;  Laterality: Left;   MOUTH SURGERY  2017   THYROID SURGERY Bilateral 1970's   Social History:  reports that she has never smoked. She has never used smokeless tobacco. She reports that she does not drink alcohol and does not use drugs.  Allergies  Allergen Reactions   Actonel [Risedronate Sodium] Swelling    swelling of tongue, no breathing problems   Codeine Other (See Comments)    dizzy   Other Other (See Comments)    Pt does not tolerate strong pains very well - makes pt not herslf    History reviewed. No pertinent family history.  Prior to Admission medications   Medication Sig Start Date End Date Taking? Authorizing Provider  ELIQUIS 2.5 MG TABS tablet Take 2.5 mg by mouth 2 (two) times daily.   Yes [provider]  levothyroxine (SYNTHROID) 125 MCG tablet Take 125 mcg by mouth daily before breakfast.   Yes [provider]  therapeutic multivitamin-minerals (THERAGRAN-M) tablet Take 1 tablet by mouth daily.   Yes [provider]  traZODone (DESYREL) 50 MG tablet Take 50 mg by mouth at bedtime.   Yes [provider]  verapamil (CALAN-SR) 240 MG CR tablet Take 1 tablet (240 mg total) by mouth at bedtime. 06/13/17  Yes Donato Heinz, MD    Physical Exam: Vitals:   02/08/2023 1008 02/10/2023 1139  BP: (!) 173/65 (!) 189/81  Pulse: (!) 106 (!) 106  Resp: 18 18  Temp: 98.2 F (36.8 C)   TempSrc: Oral   SpO2: 98% 95%   Physical Exam Constitutional:      Comments: Underweight/cachexia   HENT:     Head: Normocephalic  and atraumatic.     Nose: Nose normal.  Eyes:     Pupils: Pupils are equal, round, and reactive to light.  Cardiovascular:     Rate and Rhythm: Normal rate and regular rhythm.  Pulmonary:     Effort: Pulmonary effort is normal.  Musculoskeletal:     Comments: Decreased range of motion right lower extremity Positive TTP of the right lateral hip  Skin:    General: Skin is dry.  Neurological:     General: No focal deficit present.     Comments: Positive generalized confusion     Data Reviewed:  There are no new results to review at this time. DG Chest 1 View CLINICAL DATA:  Pain after fall.  EXAM: CHEST  1 VIEW  COMPARISON:  None Available.  FINDINGS: Enlarged cardiopericardial silhouette. Calcified aorta. Hyperinflation with chronic interstitial changes. No pneumothorax, effusion or consolidation. Overlapping cardiac leads.  IMPRESSION: Hyperinflation with chronic changes. Enlarged cardiopericardial silhouette.  Electronically Signed   By: Karen Kays M.D.   On: 01/30/2023 12:13 DG Lumbar Spine 2-3 Views CLINICAL DATA:  Pain after fall.  Hip fracture.  EXAM: LUMBAR SPINE - 2 VIEW  COMPARISON:  None Available.  FINDINGS: Five lumbar-type vertebral bodies. Severe osteopenia. Mild levoconvex curvature of the mid lumbar spine centered at L3. Preserved vertebral body height. Mild multilevel disc height loss endplate osteophytes. Prominent lower lumbar facet degenerative changes. Degenerative changes seen of the sacroiliac joints. Note is made of scattered colonic stool. Gallstones in the right upper quadrant. Deformity seen of the right femoral neck and the left pubic bone. Please correlate with separate pelvic x-rays. Overall if there is further concern of injury with a subtle osteopenia, cross-sectional imaging such as CT could be considered for further sensitivity.  IMPRESSION: Severe osteopenia with mild degenerative changes. Slight curvature of the  lumbar spine  Gallstones.  Electronically Signed   By: Karen Kays M.D.   On: 01/29/2023 12:13 DG Hip Unilat W or Wo Pelvis 2-3 Views Right CLINICAL DATA:  Fall with hip pain  EXAM: DG HIP (WITH OR WITHOUT PELVIS) 3V RIGHT  COMPARISON:  None Available.  FINDINGS: Impacted subcapital right femoral neck fracture. Remote left pubic ramus fractures with distorted obturator ring and sclerosis. Generalized osteopenia. No notable acetabular spurring.  IMPRESSION: 1. Impacted subcapital right femoral neck fracture. 2. Remote left obturator ring fractures.  Electronically Signed   By: Tiburcio Pea M.D.   On: 02/19/2023 11:00  Lab Results  Component Value Date   WBC 9.9 02/04/2023   HGB 10.6 (L) 02/07/2023   HCT 33.9 (L) 01/25/2023   MCV 82.9 02/05/2023   PLT 220 01/30/2023   Last metabolic panel Lab Results  Component Value Date   GLUCOSE 108 (H) 02/19/2023   NA 136 02/08/2023   K 4.1 02/16/2023   CL 101 02/17/2023  CO2 24 01/30/2023   BUN 24 (H) 02/11/2023   CREATININE 1.02 (H) 02/06/2023   GFRNONAA 53 (L) 02/08/2023   CALCIUM 9.3 01/30/2023   PROT 7.6 05/28/2017   ALBUMIN 4.2 05/28/2017   BILITOT 1.4 (H) 05/28/2017   ALKPHOS 50 05/28/2017   AST 17 05/28/2017   ALT 11 (L) 05/28/2017   ANIONGAP 11 01/31/2023    Assessment and Plan: * Hip fracture (HCC) Mechanical fall at home with secondary Impacted subcapital right femoral neck fracture on imaging Dr. Joice Lofts with orthopedics consulted for evaluation Coordinating operative repair in setting of atrial fibrillation with Eliquis use today Pain control Follow-up orthopedic surgery recommendations   Fall at home, initial encounter Mechanical fall at home in the setting of dementia Noted secondary right hip fracture Pending or evaluation PT OT evaluation Fall precautions Anticipate rehab Follow  Atrial fibrillation, chronic (HCC) Heart rate in low 100s with sinus tach on EKG Hold Eliquis Titrate  regimen pending operative evaluation As needed IV metoprolol for heart rate greater than 110 as BP allows  Hypothyroidism Cont synthroid pending OR       Advance Care Planning:   Code Status: Full Code   Consults: Dr. Joice Lofts w/ orthopedics   Family Communication: Daughter in law at the bedside   Greater than 50% was spent in counseling and coordination of care with patient Total encounter time 80 minutes or more  Severity of Illness: The appropriate patient status for this patient is INPATIENT. Inpatient status is judged to be reasonable and necessary in order to provide the required intensity of service to ensure the patient's safety. The patient's presenting symptoms, physical exam findings, and initial radiographic and laboratory data in the context of their chronic comorbidities is felt to place them at high risk for further clinical deterioration. Furthermore, it is not anticipated that the patient will be medically stable for discharge from the hospital within 2 midnights of admission.   * I certify that at the point of admission it is my clinical judgment that the patient will require inpatient hospital care spanning beyond 2 midnights from the point of admission due to high intensity of service, high risk for further deterioration and high frequency of surveillance required.*  Author: Floydene Flock, MD 02/17/2023 1:05 PM  For on call review [email or web address].

## 2023-02-15 NOTE — Consult Note (Signed)
ORTHOPAEDIC CONSULTATION  REQUESTING PHYSICIAN: Floydene Flock, MD  Chief Complaint:   Right hip pain.  History of Present Illness: Heather Colon is a 89 y.o. female with a history of hypertension, hypothyroidism, osteoporosis, coronary artery disease, and chronic kidney disease who lives independently with her son and daughter-in-law.  The patient was in her usual state of health last evening when she got up to go the bathroom.  She lost her balance and fell, injuring her right hip.  She was unable to bear weight this morning, so she was brought to the emergency room where x-rays demonstrated a valgus impacted right femoral neck fracture.  The patient denies any associated injuries.  She did not strike her head or lose consciousness.  The patient also denies any lightheadedness, dizziness, chest pain, shortness of breath, or other symptoms which may have precipitated her fall.  The patient normally ambulates with a walker but frequently will go without it, according to her son.  Past Medical History:  Diagnosis Date   Arthritis    Cancer (HCC)    Basal Cell Skin Cancer   Chronic kidney disease    phase 3   Coronary artery disease    Glaucoma    both eyes   Hypertension    Hypothyroidism    Osteoporosis    Past Surgical History:  Procedure Laterality Date   ABDOMINAL HYSTERECTOMY     CATARACT EXTRACTION W/ INTRAOCULAR LENS IMPLANT Left 2010   CATARACT EXTRACTION W/ INTRAOCULAR LENS IMPLANT Right 2010   EYE SURGERY Bilateral    Cataract Extraction with IOL   KNEE ARTHROPLASTY Right 06/18/2016   Procedure: COMPUTER ASSISTED TOTAL KNEE ARTHROPLASTY;  Surgeon: Donato Heinz, MD;  Location: [facility] OR[facility];  Service: Orthopedics;  Laterality: Right;   KNEE ARTHROPLASTY Left 06/10/2017   Procedure: COMPUTER ASSISTED TOTAL KNEE ARTHROPLASTY;  Surgeon: Donato Heinz, MD;  Location: [facility] OR[facility];  Service: Orthopedics;  Laterality:  Left;   MOUTH SURGERY  2017   THYROID SURGERY Bilateral 1970's   Social History   Socioeconomic History   Marital status: Widowed    Spouse name: Not on file   Number of children: Not on file   Years of education: Not on file   Highest education level: Not on file  Occupational History   Not on file  Tobacco Use   Smoking status: Never   Smokeless tobacco: Never  Vaping Use   Vaping Use: Never used  Substance and Sexual Activity   Alcohol use: No   Drug use: No   Sexual activity: Never  Other Topics Concern   Not on file  Social History Narrative   Not on file   Social Determinants of Health   Financial Resource Strain: Not on file  Food Insecurity: No Food Insecurity (02/03/2023)   Hunger Vital Sign    Worried About Running Out of Food in the Last Year: Never true    Ran Out of Food in the Last Year: Never true  Transportation Needs: No Transportation Needs (01/26/2023)   PRAPARE - Administrator, Civil Service (Medical): No    Lack of Transportation (Non-Medical): No  Physical Activity: Not on file  Stress: Not on file  Social Connections: Not on file   History reviewed. No pertinent family history. Allergies  Allergen Reactions   Actonel [Risedronate Sodium] Swelling    swelling of tongue, no breathing problems   Codeine Other (See Comments)    dizzy   Other Other (See Comments)  Pt does not tolerate strong pains very well - makes pt not herslf   Prior to Admission medications   Medication Sig Start Date End Date Taking? Authorizing Provider  ELIQUIS 2.5 MG TABS tablet Take 2.5 mg by mouth 2 (two) times daily.   Yes [provider]  levothyroxine (SYNTHROID) 125 MCG tablet Take 125 mcg by mouth daily before breakfast.   Yes [provider]  therapeutic multivitamin-minerals (THERAGRAN-M) tablet Take 1 tablet by mouth daily.   Yes [provider]  traZODone (DESYREL) 50 MG tablet Take 50 mg by mouth at bedtime.   Yes  [provider]  verapamil (CALAN-SR) 240 MG CR tablet Take 1 tablet (240 mg total) by mouth at bedtime. 06/13/17  Yes Hooten, Illene Labrador, MD   DG Chest 1 View  Result Date: 02/10/2023 CLINICAL DATA:  Pain after fall. EXAM: CHEST  1 VIEW COMPARISON:  None Available. FINDINGS: Enlarged cardiopericardial silhouette. Calcified aorta. Hyperinflation with chronic interstitial changes. No pneumothorax, effusion or consolidation. Overlapping cardiac leads. IMPRESSION: Hyperinflation with chronic changes. Enlarged cardiopericardial silhouette. Electronically Signed   By: Karen Kays M.D.   On: 01/25/2023 12:13   DG Lumbar Spine 2-3 Views  Result Date: 01/30/2023 CLINICAL DATA:  Pain after fall.  Hip fracture. EXAM: LUMBAR SPINE - 2 VIEW COMPARISON:  None Available. FINDINGS: Five lumbar-type vertebral bodies. Severe osteopenia. Mild levoconvex curvature of the mid lumbar spine centered at L3. Preserved vertebral body height. Mild multilevel disc height loss endplate osteophytes. Prominent lower lumbar facet degenerative changes. Degenerative changes seen of the sacroiliac joints. Note is made of scattered colonic stool. Gallstones in the right upper quadrant. Deformity seen of the right femoral neck and the left pubic bone. Please correlate with separate pelvic x-rays. Overall if there is further concern of injury with a subtle osteopenia, cross-sectional imaging such as CT could be considered for further sensitivity. IMPRESSION: Severe osteopenia with mild degenerative changes. Slight curvature of the lumbar spine Gallstones. Electronically Signed   By: Karen Kays M.D.   On: 02/14/2023 12:13   DG Hip Unilat W or Wo Pelvis 2-3 Views Right  Result Date: 01/31/2023 CLINICAL DATA:  Fall with hip pain EXAM: DG HIP (WITH OR WITHOUT PELVIS) 3V RIGHT COMPARISON:  None Available. FINDINGS: Impacted subcapital right femoral neck fracture. Remote left pubic ramus fractures with distorted obturator ring and  sclerosis. Generalized osteopenia. No notable acetabular spurring. IMPRESSION: 1. Impacted subcapital right femoral neck fracture. 2. Remote left obturator ring fractures. Electronically Signed   By: Tiburcio Pea M.D.   On: 02/13/2023 11:00    Positive ROS: All other systems have been reviewed and were otherwise negative with the exception of those mentioned in the HPI and as above.  Physical Exam: General:  Alert, no acute distress Psychiatric:  Patient is competent for consent with normal mood and affect   Cardiovascular:  No pedal edema Respiratory:  No wheezing, non-labored breathing GI:  Abdomen is soft and non-tender Skin:  No lesions in the area of chief complaint Neurologic:  Sensation intact distally Lymphatic:  No axillary or cervical lymphadenopathy  Orthopedic Exam:  Orthopedic examination is limited to the right hip and lower extremity.  The right lower extremity is symmetrically aligned with the left lower extremity.  Skin inspection around the right hip is unremarkable.  No swelling, erythema, ecchymosis, abrasions, or other skin abnormalities are identified.  She has mild tenderness to palpation over the lateral aspect of the right hip.  She has more severe  pain with logrolling of the hip, as well as with any attempted active motion of the hip.  She is grossly neurovascularly intact to the right lower extremity and foot.  X-rays:  X-rays of the pelvis and right hip are available for review and have been reviewed by myself.  These films demonstrate a valgus impacted right femoral neck fracture with generalized osteopenia.  No significant degenerative changes of the right hip are noted.  No other acute bony abnormalities are identified.  Assessment: Valgus impacted right femoral neck fracture.  Plan: The treatment options, including both surgical and nonsurgical choices, have been discussed in detail with the patient and her family.  The patient and her family would like to  proceed with surgical intervention to include the placement of in situ cannulated screws to stabilize a valgus impacted right femoral neck fracture.  The risks (including bleeding, infection, nerve and/or blood vessel injury, persistent or recurrent pain, loosening or failure of the components, malunion and/or nonunion, leg length inequality, need for further surgery, blood clots, strokes, heart attacks or arrhythmias, pneumonia, etc.) and benefits of the surgical procedure were discussed.  The patient and her family state their understanding and agree to proceed.  A formal written consent will be obtained by the nursing staff.  Thank you for asking me to participate in the care of this most pleasant and unfortunate woman.  I will be happy to follow her with you.   Maryagnes Amos, MD  Beeper #:  [phone]  01/27/2023 3:41 PM

## 2023-02-16 DIAGNOSIS — D649 Anemia, unspecified: Secondary | ICD-10-CM | POA: Insufficient documentation

## 2023-02-16 DIAGNOSIS — S72001S Fracture of unspecified part of neck of right femur, sequela: Secondary | ICD-10-CM

## 2023-02-16 DIAGNOSIS — I951 Orthostatic hypotension: Secondary | ICD-10-CM | POA: Insufficient documentation

## 2023-02-16 DIAGNOSIS — E039 Hypothyroidism, unspecified: Secondary | ICD-10-CM | POA: Diagnosis not present

## 2023-02-16 DIAGNOSIS — I480 Paroxysmal atrial fibrillation: Secondary | ICD-10-CM

## 2023-02-16 DIAGNOSIS — F03918 Unspecified dementia, unspecified severity, with other behavioral disturbance: Secondary | ICD-10-CM

## 2023-02-16 LAB — COMPREHENSIVE METABOLIC PANEL
ALT: 13 U/L (ref 0–44)
AST: 20 U/L (ref 15–41)
Albumin: 3.1 g/dL — ABNORMAL LOW (ref 3.5–5.0)
Alkaline Phosphatase: 55 U/L (ref 38–126)
Anion gap: 8 (ref 5–15)
BUN: 21 mg/dL (ref 8–23)
CO2: 23 mmol/L (ref 22–32)
Calcium: 8.4 mg/dL — ABNORMAL LOW (ref 8.9–10.3)
Chloride: 104 mmol/L (ref 98–111)
Creatinine, Ser: 0.93 mg/dL (ref 0.44–1.00)
GFR, Estimated: 59 mL/min — ABNORMAL LOW (ref >60)
Glucose, Bld: 109 mg/dL — ABNORMAL HIGH (ref 70–99)
Potassium: 4.1 mmol/L (ref 3.5–5.1)
Sodium: 135 mmol/L (ref 135–145)
Total Bilirubin: 0.8 mg/dL (ref 0.3–1.2)
Total Protein: 6.5 g/dL (ref 6.5–8.1)

## 2023-02-16 LAB — CBC
HCT: 31.8 % — ABNORMAL LOW (ref 36.0–46.0)
Hemoglobin: 9.9 g/dL — ABNORMAL LOW (ref 12.0–15.0)
MCH: 25.7 pg — ABNORMAL LOW (ref 26.0–34.0)
MCHC: 31.1 g/dL (ref 30.0–36.0)
MCV: 82.6 fL (ref 80.0–100.0)
Platelets: 213 10*3/uL (ref 150–400)
RBC: 3.85 MIL/uL — ABNORMAL LOW (ref 3.87–5.11)
RDW: 15.4 % (ref 11.5–15.5)
WBC: 10.9 10*3/uL — ABNORMAL HIGH (ref 4.0–10.5)
nRBC: 0 % (ref 0.0–0.2)

## 2023-02-16 LAB — URINALYSIS, COMPLETE (UACMP) WITH MICROSCOPIC
Bilirubin Urine: NEGATIVE
Glucose, UA: NEGATIVE mg/dL
Ketones, ur: NEGATIVE mg/dL
Leukocytes,Ua: NEGATIVE
Nitrite: NEGATIVE
Protein, ur: NEGATIVE mg/dL
Specific Gravity, Urine: 1.006 (ref 1.005–1.030)
Squamous Epithelial / HPF: NONE SEEN /HPF (ref 0–5)
pH: 5 (ref 5.0–8.0)

## 2023-02-16 LAB — FERRITIN: Ferritin: 19 ng/mL (ref 11–307)

## 2023-02-16 MED ORDER — CHLORHEXIDINE GLUCONATE CLOTH 2 % EX PADS
6.0000 | MEDICATED_PAD | Freq: Every day | CUTANEOUS | Status: DC
  Administered 2023-02-16: 6 via TOPICAL

## 2023-02-16 MED ORDER — QUETIAPINE FUMARATE 25 MG PO TABS
12.5000 mg | ORAL_TABLET | Freq: Every evening | ORAL | Status: DC | PRN
  Administered 2023-02-16 – 2023-02-17 (×2): 12.5 mg via ORAL
  Filled 2023-02-16 (×2): qty 1

## 2023-02-16 MED ORDER — ORAL CARE MOUTH RINSE: 15.0000 mL | OROMUCOSAL | Status: DC | PRN

## 2023-02-16 MED ORDER — ORAL CARE MOUTH RINSE
15.0000 mL | OROMUCOSAL | Status: DC
  Administered 2023-02-16 – 2023-02-17 (×8): 15 mL via OROMUCOSAL

## 2023-02-16 NOTE — Assessment & Plan Note (Deleted)
Continue to monitor closely.  Mental status may get worse here in the hospital.  Continue trazodone at night.

## 2023-02-16 NOTE — Assessment & Plan Note (Signed)
Postoperative day 2.  Continue working with physical therapy.  Likely will end up going home with home health.

## 2023-02-16 NOTE — Assessment & Plan Note (Addendum)
Patient's heart rate very tachycardic with moving around with physical therapy.

## 2023-02-16 NOTE — Progress Notes (Addendum)
The urine culture order set did not come up on this patient.  Ordered urinalysis.  Called pharmacist and they couldn't order either.  Urinalysis negative for infection, no need for urine culture.  Dr Renae Gloss

## 2023-02-16 NOTE — Hospital Course (Signed)
89 y.o. female with medical history significant of dementia, atrial fibrillation on Eliquis, hypertension, hypothyroidism, coronary artery disease, stage III CKD presenting with fall, right hip fracture.  History primarily from patient's daughter-in-law in the setting of dementia.  Per report, patient had a fall yesterday coming back from the bathroom.  Patient fell onto the right side.  No reported head trauma loss conscious.  Patient was able to get back up with a walker and go back to bed.  Patient woke up this morning with significant pain and inability to ambulate.  No reported weakness or dizziness prior to fall.  No fevers or chills.  No nausea or vomiting.  No chest pain or shortness of breath.  No abdominal pain.  On Eliquis.  No recent medication changes reported. Presented to the ER afebrile, heart rate in the low 100s, BP stable.  Satting well on room air.  White count 9.9, hemoglobin 10.6, creatinine 1.02.  Hip plain films with impacted subcapital right femoral neck fracture and remote left obturator ring fractures.  Plain films of the L-spine and chest x-ray grossly stable.  Dr Joice Lofts performed an in situ cannulated screw fixation of the valgus impacted right femoral neck fracture on the evening of 4/26.  4/27.  Patient felt a little dizzy with standing up.  Blood pressure did drop down with standing but not enough to cause dizziness. 4/28.  Patient tachycardic with physical therapy.  Will add metoprolol.  Continue verapamil.  Patient distressed about being in the hospital and wants to get out of the hospital soon as possible.  Will give IV iron today. 4/29.  Phlebotomist noticed that the patient was unresponsive and a CODE BLUE was called.  Patient did not survive the code.  Patient pronounced at 5 AM.

## 2023-02-16 NOTE — Progress Notes (Signed)
Progress Note   Patient: Heather Colon ZOX:[id number] DOB: May 08, 1933 DOA: 01/28/2023     1 DOS: the patient was seen and examined on 02/16/2023   Brief hospital course: 89 y.o. female with medical history significant of dementia, atrial fibrillation on Eliquis, hypertension, hypothyroidism, coronary artery disease, stage III CKD presenting with fall, right hip fracture.  History primarily from patient's daughter-in-law in the setting of dementia.  Per report, patient had a fall yesterday coming back from the bathroom.  Patient fell onto the right side.  No reported head trauma loss conscious.  Patient was able to get back up with a walker and go back to bed.  Patient woke up this morning with significant pain and inability to ambulate.  No reported weakness or dizziness prior to fall.  No fevers or chills.  No nausea or vomiting.  No chest pain or shortness of breath.  No abdominal pain.  On Eliquis.  No recent medication changes reported. Presented to the ER afebrile, heart rate in the low 100s, BP stable.  Satting well on room air.  White count 9.9, hemoglobin 10.6, creatinine 1.02.  Hip plain films with impacted subcapital right femoral neck fracture and remote left obturator ring fractures.  Plain films of the L-spine and chest x-ray grossly stable.  Dr Joice Lofts performed an in situ cannulated screw fixation of the valgus impacted right femoral neck fracture on the evening of 4/26.  4/27.  Patient felt a little dizzy with standing up.  Blood pressure did drop down with standing but not enough to cause dizziness.  Assessment and Plan: * Closed hip fracture requiring operative repair, right, sequela Postoperative day 1.  Patient able to shuffle and step with both feet with cueing.  Paroxysmal atrial fibrillation (HCC) Continue verapamil.  Placed back on low-dose Eliquis.  Currently sinus tachycardia.  Dementia with behavioral disturbance (HCC) Continue to watch closely.  Trazodone at night.   Mental status may get worse here in the hospital.  Hypothyroidism Continue levothyroxine  Normocytic anemia Will add on ferritin to further classify anemia.  Orthostatic hypotension Blood pressure did drop down while standing but not enough to cause dizziness.  IV fluids for now.  Continue verapamil.        Subjective: Patient felt a little dizzy with standing up with physical therapy.  Patient has dementia so she is not the best historian kept on repeating the same question about going home.  Admitted with hip fracture.  Family asking for urine analysis to be sent but I will not send with a Foley catheter.  Will send after Foley catheter discontinued.  Physical Exam: Vitals:   02/16/23 0151 02/16/23 0406 02/16/23 0822 02/16/23 0948  BP: (!) 135/54 (!) 130/59 130/60   Pulse: (!) 108 (!) 110 (!) 111   Resp:  18 16   Temp:  (!) 97.5 F (36.4 C) 98.6 F (37 C)   TempSrc:      SpO2:  99% 99%   Weight:    43.8 kg  Height:       Physical Exam HENT:     Head: Normocephalic.     Mouth/Throat:     Pharynx: No oropharyngeal exudate.  Eyes:     General: Lids are normal.     Conjunctiva/sclera: Conjunctivae normal.  Cardiovascular:     Rate and Rhythm: Regular rhythm. Tachycardia present.     Heart sounds: Normal heart sounds, S1 normal and S2 normal.  Pulmonary:     Breath sounds: No decreased breath  sounds, wheezing, rhonchi or rales.  Abdominal:     Palpations: Abdomen is soft.     Tenderness: There is no abdominal tenderness.  Musculoskeletal:     Right lower leg: No swelling.     Left lower leg: No swelling.  Skin:    General: Skin is warm.     Findings: No rash.  Neurological:     Mental Status: She is alert.     Comments: Able to straight leg raise with left leg but unable to do it with the right leg.     Data Reviewed: Creatinine 0.93, hemoglobin 9.9, white blood cell count 10.9  Family Communication: Spoke with son and daughter-in-law at the  bedside  Disposition: Status is: Inpatient Remains inpatient appropriate because: Postoperative day 1 for hip fracture repair  Planned Discharge Destination: To be determined based on clinical course    Time spent: 28 minutes  Author: Alford Highland, MD 02/16/2023 11:46 AM  For on call review [email or web address].

## 2023-02-16 NOTE — Assessment & Plan Note (Signed)
Patient was very distressed about being in the hospital and wanting to go home.  Patient was given as needed Seroquel at night.  Because of her dementia we disconnected as many of the lines as possible including telemetry, pulse ox, oxygen.

## 2023-02-16 NOTE — Assessment & Plan Note (Addendum)
Verapamil was continued and metoprolol was added for tachycardia.  Patient was placed back on Eliquis after surgery.

## 2023-02-16 NOTE — Evaluation (Signed)
Occupational Therapy Evaluation Patient Details Name: Heather Colon MRN: [id number] DOB: [birth year] Today's Date: 02/16/2023   History of Present Illness Heather Colon is a 89 y.o. female with medical history significant of dementia, atrial fibrillation on Eliquis, hypertension, hypothyroidism, coronary artery disease, stage III CKD presenting with fall, right hip fracture. Pt is now s/p cannulated R hip pinning with orders to remain TTWB.   Clinical Impression   [name] was seen for OT evaluation this date, POD#1 from above surgery. Pt presents to OT services, pleasantly confused, asking to go home ASAP. Supportive family at bedside (Son and DIL) state they provide 24/7 assistance for pt 2/2 her baseline dementia. She requires at least some assistance for dressing and sponge bathing at baseline. Per DIL, pt intermittently uses her RW however requires constant supervision from family to ensure she is safe with mobility. Pt has orders for her RLE to remain TTWB. Pt/family educated on WB precautions and DC recs, falls prevention strategies, home/routines modifications to support ADL management. Pt would benefit from additional instruction in self care skills and techniques to help maintain precautions with or without assistive devices to support recall and carryover prior to discharge.     Recommendations for follow up therapy are one component of a multi-disciplinary discharge planning process, led by the attending physician.  Recommendations may be updated based on patient status, additional functional criteria and insurance authorization.   Assistance Recommended at Discharge Frequent or constant Supervision/Assistance  Patient can return home with the following A lot of help with bathing/dressing/bathroom;A lot of help with walking and/or transfers;Assistance with cooking/housework;Help with stairs or ramp for entrance;Assist for transportation    Functional Status Assessment  Patient has  had a recent decline in their functional status and/or demonstrates limited ability to make significant improvements in function in a reasonable and predictable amount of time  Equipment Recommendations  None recommended by OT (Pt has necessary equipment)    Recommendations for Other Services       Precautions / Restrictions Precautions Precautions: Fall Restrictions Weight Bearing Restrictions: Yes RLE Weight Bearing: Touchdown weight bearing Other Position/Activity Restrictions: Toe Touch WB      Mobility Bed Mobility Overal bed mobility: Needs Assistance Bed Mobility: Supine to Sit     Supine to sit: HOB elevated, Min assist, Supervision     General bed mobility comments: Supervision to come to upright sitting position, requires MIN A to advance hips toward EOB.    Transfers Overall transfer level: Needs assistance Equipment used: Rolling walker (2 wheels) Transfers: Sit to/from Stand Sit to Stand: Min assist                  Balance Overall balance assessment: Needs assistance Sitting-balance support: Feet supported Sitting balance-Leahy Scale: Good Sitting balance - Comments: Steady sitting, reching inside BOS.   Standing balance support: Reliant on assistive device for balance, During functional activity, Bilateral upper extremity supported Standing balance-Leahy Scale: Fair Standing balance comment: Heavy reliance on BUE for support and to maintain TTWB                           ADL either performed or assessed with clinical judgement   ADL Overall ADL's : Needs assistance/impaired                                       General ADL Comments:  Pt funcitonally limited by increased RLE pain with mobility, decreased activity tolerance, and decreased cognition. She requires MIN A for bed mobility, MIN A for STS and to take side-steps toward HOB. Anticipate at least MOD A for LB ADL management including bathing and dressing with  ongoing cueing required to maintain RLE TTWB precautions.     Vision Patient Visual Report: No change from baseline       Perception     Praxis      Pertinent Vitals/Pain Pain Assessment Pain Assessment: Faces Faces Pain Scale: Hurts little more Pain Location: R hip with mobility Pain Descriptors / Indicators: Aching, Sore, Guarding Pain Intervention(s): Limited activity within patient's tolerance, Monitored during session, Repositioned     Hand Dominance Right   Extremity/Trunk Assessment Upper Extremity Assessment Upper Extremity Assessment: Generalized weakness   Lower Extremity Assessment Lower Extremity Assessment: RLE deficits/detail;Defer to PT evaluation RLE Deficits / Details: s/p R hip pinning. To remain TTWB per MD orders. RLE: Unable to fully assess due to pain RLE Coordination: decreased gross motor   Cervical / Trunk Assessment Cervical / Trunk Assessment: Normal   Communication Communication Communication: No difficulties   Cognition Arousal/Alertness: Awake/alert Behavior During Therapy: WFL for tasks assessed/performed Overall Cognitive Status: History of cognitive impairments - at baseline                                 General Comments: Pleasant, talkative, oriented to self and place. Limited situation. Family states she is near baseline level of cognition.     General Comments       Exercises Other Exercises Other Exercises: Pt/family educated on role of OT in acute setting, falls prevention strategies for home and hospital, DC recs, and WB precautions.   Shoulder Instructions      Home Living Family/patient expects to be discharged to:: Private residence Living Arrangements: Children Available Help at Discharge: Family;Available 24 hours/day Type of Home: House Home Access: Stairs to enter [employer] of Steps: 3 at side Entrance Stairs-Rails: Right;Left Home Layout: One level     Bathroom Shower/Tub:  Chief Strategy Officer: Standard Bathroom Accessibility: Yes How Accessible: Accessible via walker Home Equipment: BSC/3in1;Rolling Walker (2 wheels);Shower seat;Hand held shower head          Prior Functioning/Environment Prior Level of Function : Needs assist       Physical Assist : ADLs (physical)   ADLs (physical): Bathing;Dressing;IADLs Mobility Comments: Per family, walking with RW (using intermittently) ADLs Comments: DIL assists with bathing and dressing on a regular bassis. Pt sleeps in recliner with family in the room with her providing 24/7 supervision. Per family pt does not sleep well at night 2/2 baseline dementia. Family recently moved in with her to provide her care.        OT Problem List: Decreased strength;Decreased coordination;Pain;Decreased range of motion;Decreased cognition;Decreased activity tolerance;Decreased safety awareness;Impaired balance (sitting and/or standing);Decreased knowledge of use of DME or AE;Decreased knowledge of precautions      OT Treatment/Interventions: Self-care/ADL training;Therapeutic exercise;Therapeutic activities;DME and/or AE instruction;Patient/family education;Balance training;Energy conservation    OT Goals(Current goals can be found in the care plan section) Acute Rehab OT Goals Patient Stated Goal: To go home OT Goal Formulation: With patient/family Time For Goal Achievement: 03/02/23 Potential to Achieve Goals: Good  OT Frequency: Min 2X/week    Co-evaluation              AM-PAC OT "  6 Clicks" Daily Activity     Outcome Measure Help from another person eating meals?: A Little Help from another person taking care of personal grooming?: A Little Help from another person toileting, which includes using toliet, bedpan, or urinal?: A Little Help from another person bathing (including washing, rinsing, drying)?: A Lot Help from another person to put on and taking off regular upper body clothing?: A  Little Help from another person to put on and taking off regular lower body clothing?: A Lot 6 Click Score: 16   End of Session Equipment Utilized During Treatment: Rolling walker (2 wheels) Nurse Communication: Mobility status  Activity Tolerance: Patient tolerated treatment well Patient left: in bed;with call bell/phone within reach;with bed alarm set;with family/visitor present  OT Visit Diagnosis: Other abnormalities of gait and mobility (R26.89);Muscle weakness (generalized) (M62.81);Pain Pain - Right/Left: Right Pain - part of body: Hip                Time: 0920-0959 OT Time Calculation (min): 39 min Charges:  OT General Charges $OT Visit: 1 Visit OT Evaluation $OT Eval Moderate Complexity: 1 Mod OT Treatments $Self Care/Home Management : 23-37 mins  Rockney Ghee, M.S., OTR/L 02/16/23, 12:21 PM

## 2023-02-16 NOTE — Assessment & Plan Note (Signed)
Will add on ferritin to further classify anemia.

## 2023-02-16 NOTE — Evaluation (Signed)
Physical Therapy Evaluation Patient Details Name: Heather Colon MRN: [id number] DOB: January 18, 1933 Today's Date: 02/16/2023  History of Present Illness  Heather Colon is a 89 y.o. female with medical history significant of dementia, atrial fibrillation on Eliquis, hypertension, hypothyroidism, coronary artery disease, stage III CKD presenting with fall, right hip fracture. Pt is now s/p cannulated R hip pinning with orders to remain TTWB.  Clinical Impression  Pt received in bed with nursing care in progress. Pt agreeable to PT evaluation. Pt pleasant and confused. Talkative through out the session and follows simple on step command with cue every step. Pt assessment revealed BP supine: 156/66, Sitting 143/71 and standing 132/56 with report of dizziness thorough out the transfer. As per nurse and OT,  Pt has family 24 hrs a day who want to take pt home and provide care after discharge. Pt needs Max assist for Supine to sit and Mod to min assist of 1 for transfers. Pt participated in AROM to BLE and provided pt with written instructions to do the exs in room every 3 hrs. Pt tol tx well . Left in chair with all needs within reach and chair alarm on. PT will continue 7 x week in acute and pt will benefit from PT beyond acute care.      Recommendations for follow up therapy are one component of a multi-disciplinary discharge planning process, led by the attending physician.  Recommendations may be updated based on patient status, additional functional criteria and insurance authorization.  Follow Up Recommendations       Assistance Recommended at Discharge Frequent or constant Supervision/Assistance (24 hour assist 2/2 to cognitive imapirements and WB precautions.)  Patient can return home with the following  A lot of help with walking and/or transfers;A lot of help with bathing/dressing/bathroom;Assistance with cooking/housework;Direct supervision/assist for medications management;Direct  supervision/assist for financial management;Assist for transportation;Help with stairs or ramp for entrance    Equipment Recommendations None recommended by PT  Recommendations for Other Services       Functional Status Assessment Patient has had a recent decline in their functional status and demonstrates the ability to make significant improvements in function in a reasonable and predictable amount of time.     Precautions / Restrictions Precautions Precautions: Fall Restrictions Weight Bearing Restrictions: Yes RLE Weight Bearing: Touchdown weight bearing Other Position/Activity Restrictions: Toe Touch WB      Mobility  Bed Mobility Overal bed mobility: Needs Assistance Bed Mobility: Supine to Sit     Supine to sit: Max assist     General bed mobility comments: sup once upright on the EOB.    Transfers Overall transfer level: Needs assistance Equipment used: Rolling walker (2 wheels) Transfers: Sit to/from Stand, Bed to chair/wheelchair/BSC Sit to Stand: Min assist Stand pivot transfers: Max assist         General transfer comment: Max assit to maintain TTWB to RLE    Ambulation/Gait: unable to comply with TTWB and due to adverse affect on the BP with change of position so deferred at this point.                 General Gait Details: deferred  Stairs: Unable.             Wheelchair Mobility    Modified Rankin (Stroke Patients Only)       Balance Overall balance assessment: Needs assistance Sitting-balance support: Bilateral upper extremity supported, Feet supported Sitting balance-Leahy Scale: Good Sitting balance - Comments: Steady sitting, reching inside BOS.  Standing balance support: Reliant on assistive device for balance, During functional activity, Bilateral upper extremity supported Standing balance-Leahy Scale: Fair Standing balance comment: Heavy reliance on BUE for support and to maintain TTWB, MAx cues and assist to comply  iwth TTWB.                             Pertinent Vitals/Pain Pain Assessment Pain Assessment: 0-10 Pain Score: 4  Pain Location: R hip with mobility Pain Descriptors / Indicators: Aching, Sore, Guarding Pain Intervention(s): Monitored during session    Home Living Family/patient expects to be discharged to:: Private residence Living Arrangements: Children Available Help at Discharge: Family;Available 24 hours/day Type of Home: House Home Access: Stairs to enter Entrance Stairs-Rails: Doctor, general practice of Steps: 3 at side   Home Layout: One level Home Equipment: Teacher, English as a foreign language (2 wheels);Shower seat;Hand held shower head      Prior Function Prior Level of Function : Needs assist  Cognitive Assist : Mobility (cognitive) Mobility (Cognitive): Step by step cues   Physical Assist : Mobility (physical) Mobility (physical): Bed mobility;Transfers ADLs (physical): Bathing;Dressing;IADLs Mobility Comments: Per family, walking with RW (using intermittently) ADLs Comments: DIL assists with bathing and dressing on a regular bassis. Pt sleeps in recliner with family in the room with her providing 24/7 supervision. Per family pt does not sleep well at night 2/2 baseline dementia. Family recently moved in with her to provide her care.     Hand Dominance   Dominant Hand: Right    Extremity/Trunk Assessment   Upper Extremity Assessment Upper Extremity Assessment: Defer to OT evaluation    Lower Extremity Assessment Lower Extremity Assessment: RLE deficits/detail;Generalized weakness RLE Deficits / Details: s/p R hip pinning. To remain TTWB per MD orders. RLE: Unable to fully assess due to pain RLE Sensation: WNL RLE Coordination: decreased gross motor    Cervical / Trunk Assessment Cervical / Trunk Assessment: Normal  Communication   Communication: No difficulties  Cognition Arousal/Alertness: Awake/alert Behavior During Therapy: WFL for  tasks assessed/performed Overall Cognitive Status: History of cognitive impairments - at baseline                                 General Comments: Pleasant, talkative, oriented to self and place. Limited situation. Family states she is near baseline level of cognition.        General Comments      Exercises General Exercises - Lower Extremity Ankle Circles/Pumps: AROM, 10 reps, Seated, Both Gluteal Sets: AROM, Both, 10 reps, Seated Short Arc Quad: AROM, 10 reps, Both, Seated Heel Slides: AROM, Both, 10 reps, Seated Other Exercises Other Exercises: Pt educated on role of PT in acute setting, falls prevention strategies for home and hospital, DC recs, and WB precautions. Carryover questinable.   Assessment/Plan    PT Assessment Patient needs continued PT services  PT Problem List Decreased strength;Decreased range of motion;Decreased activity tolerance;Decreased balance;Decreased mobility;Decreased cognition;Decreased safety awareness;Decreased knowledge of use of DME;Decreased knowledge of precautions;Pain       PT Treatment Interventions Gait training;Stair training;Functional mobility training;Therapeutic activities;Therapeutic exercise;Balance training;Neuromuscular re-education;Cognitive remediation;Patient/family education    PT Goals (Current goals can be found in the Care Plan section)  Acute Rehab PT Goals Patient Stated Goal: unable PT Goal Formulation: Patient unable to participate in goal setting (As per OT, family wants to tke the pt home and provide care with home health.)  Time For Goal Achievement: 03/02/23 Potential to Achieve Goals: Good    Frequency 7X/week     Co-evaluation               AM-PAC PT "6 Clicks" Mobility  Outcome Measure Help needed turning from your back to your side while in a flat bed without using bedrails?: A Lot Help needed moving from lying on your back to sitting on the side of a flat bed without using bedrails?:  A Lot Help needed moving to and from a bed to a chair (including a wheelchair)?: A Lot Help needed standing up from a chair using your arms (e.g., wheelchair or bedside chair)?: A Little Help needed to walk in hospital room?: Total Help needed climbing 3-5 steps with a railing? : Total 6 Click Score: 11    End of Session Equipment Utilized During Treatment: Gait belt Activity Tolerance: Patient tolerated treatment well Patient left: in chair;with call bell/phone within reach;with nursing/sitter in room;with chair alarm set Nurse Communication: Mobility status PT Visit Diagnosis: Unsteadiness on feet (R26.81);Other abnormalities of gait and mobility (R26.89);Muscle weakness (generalized) (M62.81);Repeated falls (R29.6);Difficulty in walking, not elsewhere classified (R26.2);Pain Pain - Right/Left: Right Pain - part of body: Hip    Time: 1101-1131 PT Time Calculation (min) (ACUTE ONLY): 30 min   Charges:   PT Evaluation $PT Eval Low Complexity: 1 Low PT Treatments $Therapeutic Activity: 8-22 mins       Janet Berlin PT DPT 2:46 PM,02/16/23

## 2023-02-16 NOTE — Assessment & Plan Note (Addendum)
Levothyroxine given during the hospital course

## 2023-02-16 NOTE — Progress Notes (Signed)
Subjective: 1 Day Post-Op Procedure(s) (LRB): CANNULATED HIP PINNING (Right) Patient reports pain as mild.   Patient is well, and has had no acute complaints or problems.  Denies any N/V, abdominal discomfort, chest pain, shortness of breath or pain along her leg.  States that she is able to feel her feet and wiggle her toes. We will start therapy today.  Discussed with the family in the room (her son and daughter-in-law).  They are inquiring as to her placement.  They would like her to be placed into a short-term rehab center, states it would be hard to take care of her at home given her mentation baseline.  Plan is to go to a short-term rehab facility after hospital stay.  Objective: Vital signs in last 24 hours: Temp:  [97 F (36.1 C)-98.6 F (37 C)] 98.6 F (37 C) (04/27 0822) Pulse Rate:  [100-111] 111 (04/27 0822) Resp:  [13-20] 16 (04/27 0822) BP: (119-198)/(54-105) 130/60 (04/27 0822) SpO2:  [93 %-100 %] 99 % (04/27 0822) Weight:  [43.8 kg] 43.8 kg (04/27 0948)  Vitals reviewed, stable  Intake/Output from previous day:  Intake/Output Summary (Last 24 hours) at 02/16/2023 1232 Last data filed at 02/16/2023 0827 Gross per 24 hour  Intake 350 ml  Output 1660 ml  Net -1310 ml    Intake/Output this shift: Total I/O In: -  Out: 250 [Urine:250]  Patient recently had Foley pulled earlier this morning, still has not voided.  Continue to evaluate to make sure patient voids  Labs: Recent Labs    02/06/2023 1124 02/16/23 0610  HGB 10.6* 9.9*   Recent Labs    01/23/2023 1124 02/16/23 0610  WBC 9.9 10.9*  RBC 4.09 3.85*  HCT 33.9* 31.8*  PLT 220 213   Recent Labs    02/10/2023 1124 02/16/23 0610  NA 136 135  K 4.1 4.1  CL 101 104  CO2 24 23  BUN 24* 21  CREATININE 1.02* 0.93  GLUCOSE 108* 109*  CALCIUM 9.3 8.4*   Recent Labs    02/17/2023 1124  INR 1.6*   Labs reviewed, slight decrease in H&H, slight increase in white blood cell count.  Continue to trend with  labs tomorrow.  EXAM General - Patient is Alert, Appropriate, and Confused.  Son and daughter-in-law state that this is her baseline. Extremity - Neurologically intact ABD soft Neurovascular intact Sensation intact distally Intact pulses distally Dorsiflexion/Plantar flexion intact Incision: no drainage No cellulitis present Compartment soft Dressing - no drainage Motor Function - intact, moving foot and toes well on exam.  Patient able to dorsi and plantar flex without difficulty.  Patient is able to raise her leg from the hip with mild pain.  Past Medical History:  Diagnosis Date   Arthritis    Cancer (HCC)    Basal Cell Skin Cancer   Chronic kidney disease    phase 3   Coronary artery disease    Glaucoma    both eyes   Hypertension    Hypothyroidism    Osteoporosis     Assessment/Plan: 1 Day Post-Op Procedure(s) (LRB): CANNULATED HIP PINNING (Right) Principal Problem:   Closed hip fracture requiring operative repair, right, sequela Active Problems:   Hypothyroidism   Paroxysmal atrial fibrillation (HCC)   Dementia with behavioral disturbance (HCC)   Orthostatic hypotension   Normocytic anemia  Estimated body mass index is 18.47 kg/m as calculated from the following:   Height as of this encounter: 5' 0.63" (1.54 m).   Weight as  of this encounter: 43.8 kg. Up with therapy Discussed with family in the room, family stated it may be beneficial to have her go to a short-term care facility for rehabilitation given her mentation - follow along with social work Patient has oxycodone ordered for pain  DVT Prophylaxis -  Eliquis Weight Bearing As Tolerated right leg Continue to work with Physical Therapy  Patient will follow-up with [facility] clinic in 2 weeks for reevaluation of her right hip  Rayburn Go, PA-C [facility] 02/16/2023, 12:32 PM

## 2023-02-17 DIAGNOSIS — I480 Paroxysmal atrial fibrillation: Secondary | ICD-10-CM | POA: Diagnosis not present

## 2023-02-17 DIAGNOSIS — F03918 Unspecified dementia, unspecified severity, with other behavioral disturbance: Secondary | ICD-10-CM | POA: Diagnosis not present

## 2023-02-17 DIAGNOSIS — E039 Hypothyroidism, unspecified: Secondary | ICD-10-CM | POA: Diagnosis not present

## 2023-02-17 DIAGNOSIS — S72001S Fracture of unspecified part of neck of right femur, sequela: Secondary | ICD-10-CM | POA: Diagnosis not present

## 2023-02-17 LAB — BASIC METABOLIC PANEL
Anion gap: 6 (ref 5–15)
BUN: 27 mg/dL — ABNORMAL HIGH (ref 8–23)
CO2: 23 mmol/L (ref 22–32)
Calcium: 8.2 mg/dL — ABNORMAL LOW (ref 8.9–10.3)
Chloride: 106 mmol/L (ref 98–111)
Creatinine, Ser: 0.98 mg/dL (ref 0.44–1.00)
GFR, Estimated: 56 mL/min — ABNORMAL LOW (ref >60)
Glucose, Bld: 102 mg/dL — ABNORMAL HIGH (ref 70–99)
Potassium: 3.8 mmol/L (ref 3.5–5.1)
Sodium: 135 mmol/L (ref 135–145)

## 2023-02-17 LAB — CBC
HCT: 27 % — ABNORMAL LOW (ref 36.0–46.0)
Hemoglobin: 8.4 g/dL — ABNORMAL LOW (ref 12.0–15.0)
MCH: 25.8 pg — ABNORMAL LOW (ref 26.0–34.0)
MCHC: 31.1 g/dL (ref 30.0–36.0)
MCV: 83.1 fL (ref 80.0–100.0)
Platelets: 187 10*3/uL (ref 150–400)
RBC: 3.25 MIL/uL — ABNORMAL LOW (ref 3.87–5.11)
RDW: 15.9 % — ABNORMAL HIGH (ref 11.5–15.5)
WBC: 8.7 10*3/uL (ref 4.0–10.5)
nRBC: 0 % (ref 0.0–0.2)

## 2023-02-17 MED ORDER — SODIUM CHLORIDE 0.9 % IV SOLN: 300.0000 mg | Freq: Once | INTRAVENOUS | Status: DC

## 2023-02-17 MED ORDER — VERAPAMIL HCL ER 240 MG PO TBCR
240.0000 mg | EXTENDED_RELEASE_TABLET | Freq: Every day | ORAL | Status: DC
  Administered 2023-02-17: 240 mg via ORAL
  Filled 2023-02-17: qty 1

## 2023-02-17 MED ORDER — METOPROLOL TARTRATE 25 MG PO TABS
12.5000 mg | ORAL_TABLET | Freq: Two times a day (BID) | ORAL | Status: DC
  Administered 2023-02-17: 12.5 mg via ORAL
  Filled 2023-02-17: qty 1

## 2023-02-17 MED ORDER — POLYETHYLENE GLYCOL 3350 17 G PO PACK
17.0000 g | PACK | Freq: Every day | ORAL | Status: DC
  Administered 2023-02-17: 17 g via ORAL
  Filled 2023-02-17: qty 1

## 2023-02-17 MED ORDER — VERAPAMIL HCL ER 180 MG PO TBCR: 300.0000 mg | EXTENDED_RELEASE_TABLET | Freq: Every day | ORAL | Status: DC

## 2023-02-17 MED ORDER — METOPROLOL TARTRATE 25 MG PO TABS
25.0000 mg | ORAL_TABLET | Freq: Two times a day (BID) | ORAL | Status: DC
  Administered 2023-02-17: 25 mg via ORAL
  Filled 2023-02-17: qty 1

## 2023-02-17 MED ORDER — SODIUM CHLORIDE 0.9 % IV SOLN
125.0000 mg | Freq: Once | INTRAVENOUS | Status: AC
  Administered 2023-02-17: 125 mg via INTRAVENOUS
  Filled 2023-02-17: qty 10

## 2023-02-17 NOTE — Progress Notes (Signed)
Subjective: 2 Days Post-Op Procedure(s) (LRB): CANNULATED HIP PINNING (Right) Patient reports pain as mild.   Patient is well, and has had no acute complaints or problems.  Denies any N/V, abdominal discomfort, chest pain, shortness of breath or pain along her leg.  States that she is able to feel her feet and wiggle her toes. States that she has been up to the bathroom to urinate.  We will continue with therapy today.  Discussed with the family in the room (her son and daughter-in-law).  They are inquiring as to her placement.  They have been going back and forth between having her in a short-term rehab center versus staying at home with them.  States that somebody would always be home looking after her.  They are currently leaning towards having her come home with them upon discharge.  She discussed with inpatient care and orthopedics to establish plan for discharge.  Objective: Vital signs in last 24 hours: Temp:  [97.3 F (36.3 C)-98.4 F (36.9 C)] 98.1 F (36.7 C) (04/28 0943) Pulse Rate:  [112-156] 116 (04/28 0943) Resp:  [16-20] 18 (04/28 0943) BP: (124-171)/(59-76) 166/72 (04/28 0943) SpO2:  [92 %-96 %] 96 % (04/28 0943)  Vitals reviewed, stable  Intake/Output from previous day:  Intake/Output Summary (Last 24 hours) at 02/17/2023 1004 Last data filed at 02/17/2023 0935 Gross per 24 hour  Intake 2496.41 ml  Output 1000 ml  Net 1496.41 ml    Intake/Output this shift: Total I/O In: -  Out: 400 [Urine:400]  Patient recently had Foley pulled earlier this morning, still has not voided.  Continue to evaluate to make sure patient voids  Labs: Recent Labs    01/26/2023 1124 02/16/23 0610 02/17/23 0547  HGB 10.6* 9.9* 8.4*   Recent Labs    02/16/23 0610 02/17/23 0547  WBC 10.9* 8.7  RBC 3.85* 3.25*  HCT 31.8* 27.0*  PLT 213 187   Recent Labs    02/16/23 0610 02/17/23 0547  NA 135 135  K 4.1 3.8  CL 104 106  CO2 23 23  BUN 21 27*  CREATININE 0.93 0.98   GLUCOSE 109* 102*  CALCIUM 8.4* 8.2*   Recent Labs    02/13/2023 1124  INR 1.6*   Labs reviewed, slight decrease in H&H -9.9-8.4 on 4/28.  White blood cell count has come back down to 8.7.  Continue to trend with H&H tomorrow.  EXAM General - Patient is Alert, Appropriate, and Confused.  Son and daughter-in-law state that this is her baseline. Extremity - Neurologically intact ABD soft Neurovascular intact Sensation intact distally Intact pulses distally Dorsiflexion/Plantar flexion intact Incision: no drainage No cellulitis present Compartment soft Dressing - no drainage Motor Function - intact, moving foot and toes well on exam.  Patient able to dorsi and plantar flex without difficulty.  Patient is able to raise her leg from the hip with mild pain.  Past Medical History:  Diagnosis Date   Arthritis    Cancer (HCC)    Basal Cell Skin Cancer   Chronic kidney disease    phase 3   Coronary artery disease    Glaucoma    both eyes   Hypertension    Hypothyroidism    Osteoporosis     Assessment/Plan: 2 Days Post-Op Procedure(s) (LRB): CANNULATED HIP PINNING (Right) Principal Problem:   Closed hip fracture requiring operative repair, right, sequela Active Problems:   Hypothyroidism   Paroxysmal atrial fibrillation (HCC)   Dementia with behavioral disturbance (HCC)   Orthostatic  hypotension   Normocytic anemia  Estimated body mass index is 18.47 kg/m as calculated from the following:   Height as of this encounter: 5' 0.63" (1.54 m).   Weight as of this encounter: 43.8 kg. Up with therapy Discussed with family in the room, family is leaning towards having her come home.  States that her mentation has been better- follow along with inpatient services and social work on decision for placement Patient has oxycodone ordered for pain, reports mild pain. Patient has yet to have a bowel movement since surgery, continue to monitor now that she is ambulating more  consistently. H&H's were reviewed, continue to decrease.  Will place a H&H order for tomorrow morning for reevaluation levels.  DVT Prophylaxis -  Eliquis Weight Bearing As Tolerated right leg Continue to work with Physical Therapy  Patient will follow-up with [facility] clinic in 2 weeks for reevaluation of her right hip  Rayburn Go, PA-C [facility] 02/17/2023, 10:04 AM

## 2023-02-17 NOTE — Progress Notes (Signed)
   02/16/23 2008  Assess: MEWS Score  Temp 98.2 F (36.8 C)  BP (!) 171/76 (Notified Therapist, nutritional of pt HR and BP)  MAP (mmHg) 100  Pulse Rate (!) 156 (Notified Therapist, nutritional of pt HR and BP)  Resp 20  SpO2 92 %  O2 Device Room Air  O2 Flow Rate (L/min) 2 L/min  Assess: MEWS Score  MEWS Temp 0  MEWS Systolic 0  MEWS Pulse 3  MEWS RR 0  MEWS LOC 0  MEWS Score 3  MEWS Score Color Yellow  Assess: if the MEWS score is Yellow or Red  Were vital signs taken at a resting state? Yes  Focused Assessment No change from prior assessment  Does the patient meet 2 or more of the SIRS criteria? No  Does the patient have a confirmed or suspected source of infection? No  Provider and Rapid Response Notified? No  MEWS guidelines implemented  Yes, yellow  Treat  MEWS Interventions Considered administering scheduled or prn medications/treatments as ordered  Take Vital Signs  Increase Vital Sign Frequency  Yellow: Q2hr x1, continue Q4hrs until patient remains green for 12hrs  Escalate  MEWS: Escalate Yellow: Discuss with charge nurse and consider notifying provider and/or RRT  Notify: Charge Nurse/RN  Name of Charge Nurse/RN Notified Dorie, RN  Assess: SIRS CRITERIA  SIRS Temperature  0  SIRS Pulse 1  SIRS Respirations  0  SIRS WBC 0  SIRS Score Sum  1

## 2023-02-17 NOTE — Plan of Care (Signed)
  Problem: Education: Goal: Knowledge of General Education information will improve Description: Including pain rating scale, medication(s)/side effects and non-pharmacologic comfort measures Outcome: Progressing   Problem: Health Behavior/Discharge Planning: Goal: Ability to manage health-related needs will improve Outcome: Progressing   Problem: Clinical Measurements: Goal: Ability to maintain clinical measurements within normal limits will improve Outcome: Progressing Goal: Will remain free from infection Outcome: Progressing Goal: Diagnostic test results will improve Outcome: Progressing Goal: Respiratory complications will improve Outcome: Progressing Goal: Cardiovascular complication will be avoided Outcome: Progressing   Problem: Education: Goal: Knowledge of General Education information will improve Description: Including pain rating scale, medication(s)/side effects and non-pharmacologic comfort measures Outcome: Progressing   Problem: Activity: Goal: Risk for activity intolerance will decrease Outcome: Progressing   Problem: Nutrition: Goal: Adequate nutrition will be maintained Outcome: Progressing   Problem: Coping: Goal: Level of anxiety will decrease Outcome: Progressing   Problem: Elimination: Goal: Will not experience complications related to bowel motility Outcome: Progressing Goal: Will not experience complications related to urinary retention Outcome: Progressing

## 2023-02-17 NOTE — Progress Notes (Signed)
Physical Therapy Treatment Patient Details Name: [name] MRN: [id number] DOB: 02-Jan-1933 Today's Date: 02/17/2023   History of Present Illness Heather Colon is a 89 y.o. female with medical history significant of dementia, atrial fibrillation on Eliquis, hypertension, hypothyroidism, coronary artery disease, stage III CKD presenting with fall, right hip fracture. Pt is now s/p cannulated R hip pinning with orders to remain TTWB.    PT Comments    Pt in bed.  OK by RN to proceed.  HR 110's at rest Sats 93% at rest.  X10 supine ex BLE with AAROM for technique and to keep on task.  HR noted increase to 158 and sats 89%.  Discussed with primary MD who ok'ed mobility.  She is able to transition to EOB with min a x 1, steady in sitting.  Stood and transferred to recliner -> BSC-> recliner with RW and min a x 1 .  She does not maintain TTWB so limited to transfers only.  Discussed with family and recommended she remain transfers only with wheelchair level mobility until WB status changes.  They do request a new RW for discharge but they have all other equipment needed.     Recommendations for follow up therapy are one component of a multi-disciplinary discharge planning process, led by the attending physician.  Recommendations may be updated based on patient status, additional functional criteria and insurance authorization.  Follow Up Recommendations       Assistance Recommended at Discharge Frequent or constant Supervision/Assistance  Patient can return home with the following A little help with walking and/or transfers;A little help with bathing/dressing/bathroom;Assistance with cooking/housework;Assistance with feeding;Direct supervision/assist for medications management;Direct supervision/assist for financial management;Assist for transportation;Help with stairs or ramp for entrance   Equipment Recommendations  Rolling walker (2 wheels)    Recommendations for Other Services        Precautions / Restrictions Precautions Precautions: Fall Restrictions Weight Bearing Restrictions: Yes RLE Weight Bearing: Touchdown weight bearing Other Position/Activity Restrictions: Toe Touch WB     Mobility  Bed Mobility Overal bed mobility: Needs Assistance Bed Mobility: Supine to Sit     Supine to sit: Min assist          Transfers Overall transfer level: Needs assistance Equipment used: Rolling walker (2 wheels) Transfers: Sit to/from Stand, Bed to chair/wheelchair/BSC Sit to Stand: Min assist           General transfer comment: min a with RW - doesn not maintain TTWB so limited to transfers only    Ambulation/Gait               General Gait Details: deferred as she is unable to maintain ttwb and anticipate her being wheelchair level until WB status changes.   Stairs             Wheelchair Mobility    Modified Rankin (Stroke Patients Only)       Balance Overall balance assessment: Needs assistance Sitting-balance support: Bilateral upper extremity supported, Feet supported Sitting balance-Leahy Scale: Good     Standing balance support: Bilateral upper extremity supported Standing balance-Leahy Scale: Fair Standing balance comment: Heavy reliance on BUE for support and to maintain TTWB, MAx cues and assist to comply iwth TTWB.                            Cognition Arousal/Alertness: Awake/alert Behavior During Therapy: WFL for tasks assessed/performed Overall Cognitive Status: History of cognitive impairments - at baseline  Exercises Other Exercises Other Exercises: supine AAROM x 10 with verbal and tactile cues to stay on task    General Comments        Pertinent Vitals/Pain Pain Assessment Pain Assessment: Faces Faces Pain Scale: Hurts a little bit Pain Location: R hip with mobility Pain Descriptors / Indicators: Aching, Sore, Guarding Pain  Intervention(s): Limited activity within patient's tolerance, Monitored during session, Repositioned    Home Living                          Prior Function            PT Goals (current goals can now be found in the care plan section) Progress towards PT goals: Progressing toward goals    Frequency    7X/week      PT Plan Current plan remains appropriate    Co-evaluation              AM-PAC PT "6 Clicks" Mobility   Outcome Measure  Help needed turning from your back to your side while in a flat bed without using bedrails?: A Little Help needed moving from lying on your back to sitting on the side of a flat bed without using bedrails?: A Little Help needed moving to and from a bed to a chair (including a wheelchair)?: A Little Help needed standing up from a chair using your arms (e.g., wheelchair or bedside chair)?: A Little Help needed to walk in hospital room?: Total Help needed climbing 3-5 steps with a railing? : Total 6 Click Score: 14    End of Session Equipment Utilized During Treatment: Gait belt Activity Tolerance: Patient tolerated treatment well Patient left: in chair;with call bell/phone within reach;with chair alarm set;with family/visitor present Nurse Communication: Mobility status PT Visit Diagnosis: Unsteadiness on feet (R26.81);Other abnormalities of gait and mobility (R26.89);Muscle weakness (generalized) (M62.81);Repeated falls (R29.6);Difficulty in walking, not elsewhere classified (R26.2);Pain Pain - Right/Left: Right Pain - part of body: Hip     Time: 1610-9604 PT Time Calculation (min) (ACUTE ONLY): 41 min  Charges:  $Therapeutic Exercise: 8-22 mins $Therapeutic Activity: 23-37 mins                   Heather Colon, PTA 02/17/23, 1:12 PM

## 2023-02-17 NOTE — TOC Initial Note (Addendum)
Transition of Care [facility]) - Initial/Assessment Note    Patient Details  Name: Heather Colon MRN: [id number] Date of Birth: Jun 10, 1933  Transition of Care [facility]) CM/SW Contact:    Kemper Durie, RN Phone Number: 02/17/2023, 11:25 AM  Clinical Narrative:                  Patient admitted from home, son and daughter in law live with her and are her caregivers.  Spoke with son, aware of recommendations for home health PT/OT/aide, agrees and does not have a preference.  Referral accepted by [facility] Kandee Keen) to cover once patient is medically clear for discharge.  Son does feel she would benefit from rolling walker, will request order.  Already has wheelchair, shower chair, and BSC at home.   Expected Discharge Plan: Home w Home Health Services Barriers to Discharge: Continued Medical Work up   Patient Goals and CMS Choice Patient states their goals for this hospitalization and ongoing recovery are:: Home with [facility] services CMS Medicare.gov Compare Post Acute Care list provided to:: Patient Choice offered to / list presented to : Adult Children      Expected Discharge Plan and Services     Post Acute Care Choice: Home Health Living arrangements for the past 2 months: Single Family Home                           HH Arranged: PT, OT HH Agency: [facility] Date [facility] Agency Contacted: 02/17/23 Time HH Agency Contacted: 1125 Representative spoke with at [facility] Agency: Kandee Keen  Prior Living Arrangements/Services Living arrangements for the past 2 months: Single Family Home Lives with:: Adult Children              Current home services: DME    Activities of Daily Living Home Assistive Devices/Equipment: Environmental consultant (specify type), Eyeglasses ADL Screening (condition at time of admission) Patient's cognitive ability adequate to safely complete daily activities?: Yes Is the patient deaf or have difficulty hearing?: No Does the patient have difficulty seeing, even when wearing  glasses/contacts?: No Does the patient have difficulty concentrating, remembering, or making decisions?: Yes Patient able to express need for assistance with ADLs?: Yes Does the patient have difficulty dressing or bathing?: Yes Independently performs ADLs?: No Communication: Independent Dressing (OT): Needs assistance Is this a change from baseline?: Pre-admission baseline Grooming: Needs assistance Is this a change from baseline?: Pre-admission baseline Feeding: Independent Bathing: Needs assistance Is this a change from baseline?: Pre-admission baseline Toileting: Needs assistance Is this a change from baseline?: Change from baseline, expected to last >3days In/Out Bed: Needs assistance Is this a change from baseline?: Change from baseline, expected to last >3 days Walks in Home: Needs assistance Is this a change from baseline?: Change from baseline, expected to last >3 days Does the patient have difficulty walking or climbing stairs?: Yes Weakness of Legs: Both Weakness of Arms/Hands: None  Permission Sought/Granted                  Emotional Assessment              Admission diagnosis:  Hip fracture (HCC) [S72.009A] Fall, initial encounter [W19.XXXA] Closed fracture of right hip, initial encounter [facility]) [S72.001A] Patient Active Problem List   Diagnosis Date Noted   Closed hip fracture requiring operative repair, right, sequela 02/16/2023   Paroxysmal atrial fibrillation (HCC) 02/16/2023   Dementia with behavioral disturbance (HCC) 02/16/2023   Orthostatic hypotension 02/16/2023  Normocytic anemia 02/16/2023   Hypothyroidism 02/16/2023   S/P total knee arthroplasty 06/18/2016   PCP:  Lauro Regulus, MD Pharmacy:   [facility] - [place], [place] - [address] [place] [address] Phone: [phone] Fax: [phone]     Social Determinants of Health (SDOH) Social History: SDOH Screenings   Food Insecurity: No Food  Insecurity (01/29/2023)  Housing: Low Risk  (01/22/2023)  Transportation Needs: No Transportation Needs (02/14/2023)  Utilities: Not At Risk (02/10/2023)  Tobacco Use: Low Risk  (02/14/2023)   SDOH Interventions:     Readmission Risk Interventions     No data to display

## 2023-02-17 NOTE — Progress Notes (Signed)
Progress Note   Patient: Heather Colon ZOX:[id number] DOB: 05-Mar-1933 DOA: 01/22/2023     2 DOS: the patient was seen and examined on 02/17/2023   Brief hospital course: 89 y.o. female with medical history significant of dementia, atrial fibrillation on Eliquis, hypertension, hypothyroidism, coronary artery disease, stage III CKD presenting with fall, right hip fracture.  History primarily from patient's daughter-in-law in the setting of dementia.  Per report, patient had a fall yesterday coming back from the bathroom.  Patient fell onto the right side.  No reported head trauma loss conscious.  Patient was able to get back up with a walker and go back to bed.  Patient woke up this morning with significant pain and inability to ambulate.  No reported weakness or dizziness prior to fall.  No fevers or chills.  No nausea or vomiting.  No chest pain or shortness of breath.  No abdominal pain.  On Eliquis.  No recent medication changes reported. Presented to the ER afebrile, heart rate in the low 100s, BP stable.  Satting well on room air.  White count 9.9, hemoglobin 10.6, creatinine 1.02.  Hip plain films with impacted subcapital right femoral neck fracture and remote left obturator ring fractures.  Plain films of the L-spine and chest x-ray grossly stable.  Dr Joice Lofts performed an in situ cannulated screw fixation of the valgus impacted right femoral neck fracture on the evening of 4/26.  4/27.  Patient felt a little dizzy with standing up.  Blood pressure did drop down with standing but not enough to cause dizziness. 4/28.  Patient tachycardic with physical therapy.  Will add metoprolol.  Continue verapamil.  Patient distressed about being in the hospital and wants to get out of the hospital soon as possible.  Will give IV iron today.  Assessment and Plan: * Closed hip fracture requiring operative repair, right, sequela Postoperative day 2.  Continue working with physical therapy.  Likely will end up  going home with home health.  Paroxysmal atrial fibrillation (HCC) Continue verapamil.  Placed back on low-dose Eliquis.  Add metoprolol for tachycardia.  Dementia with behavioral disturbance (HCC) Continue to watch closely.  Trazodone at night.  As needed Seroquel.  Hypothyroidism Continue levothyroxine  Normocytic anemia Iron deficiency anemia.  Hemoglobin down to 8.4.  Discontinue IV fluids.  Will give IV iron today.  Orthostatic hypotension Patient's heart rate goes fast well moving.  Need to control heart rate.  Discontinue IV fluids with hemoglobin drifting down.        Subjective: Patient really distressed about being here in the hospital and wants to get out of the hospital soon as possible.  Physical Exam: Vitals:   02/16/23 2008 02/17/23 0014 02/17/23 0425 02/17/23 0943  BP: (!) 171/76 124/66 (!) 146/67 (!) 166/72  Pulse: (!) 156 (!) 112 (!) 116 (!) 116  Resp: 20 16 16 18   Temp: 98.2 F (36.8 C) 98.3 F (36.8 C) 98.4 F (36.9 C) 98.1 F (36.7 C)  TempSrc:      SpO2: 92% 94% 94% 96%  Weight:      Height:       Physical Exam HENT:     Head: Normocephalic.     Mouth/Throat:     Pharynx: No oropharyngeal exudate.  Eyes:     General: Lids are normal.     Conjunctiva/sclera: Conjunctivae normal.  Cardiovascular:     Rate and Rhythm: Regular rhythm. Tachycardia present.     Heart sounds: Normal heart sounds, S1 normal and  S2 normal.  Pulmonary:     Breath sounds: No decreased breath sounds, wheezing, rhonchi or rales.  Abdominal:     Palpations: Abdomen is soft.     Tenderness: There is no abdominal tenderness.  Musculoskeletal:     Right lower leg: No swelling.     Left lower leg: No swelling.  Skin:    General: Skin is warm.     Findings: No rash.  Neurological:     Mental Status: She is alert.     Comments: Able to straight leg raise with left leg but unable to do it with the right leg.     Data Reviewed: Hemoglobin 8.4, creatinine  0.98  Family Communication: Spoke with family at the bedside  Disposition: Status is: Inpatient Remains inpatient appropriate because: Postoperative day 2 for hip fracture repair  Planned Discharge Destination: Home with Home Health    Time spent: 28 minutes  Author: Alford Highland, MD 02/17/2023 1:11 PM  For on call review [email or web address].

## 2023-02-18 ENCOUNTER — Encounter: Payer: Self-pay | Admitting: Surgery

## 2023-02-18 DIAGNOSIS — I951 Orthostatic hypotension: Secondary | ICD-10-CM

## 2023-02-18 DIAGNOSIS — S72001D Fracture of unspecified part of neck of right femur, subsequent encounter for closed fracture with routine healing: Secondary | ICD-10-CM

## 2023-02-18 DIAGNOSIS — D649 Anemia, unspecified: Secondary | ICD-10-CM

## 2023-02-18 DIAGNOSIS — I469 Cardiac arrest, cause unspecified: Secondary | ICD-10-CM

## 2023-02-18 DIAGNOSIS — F03918 Unspecified dementia, unspecified severity, with other behavioral disturbance: Secondary | ICD-10-CM

## 2023-02-18 DIAGNOSIS — I480 Paroxysmal atrial fibrillation: Secondary | ICD-10-CM

## 2023-02-18 MED ORDER — TENECTEPLASE 50 MG IV KIT
30.0000 mg | PACK | Freq: Once | INTRAVENOUS | Status: DC
  Filled 2023-02-18 (×2): qty 10

## 2023-02-20 NOTE — Death Summary Note (Signed)
DEATH SUMMARY   Patient Details  Name: Heather Colon MRN: [id number] DOB: 02/14/1933 WUJ:[id number], Marya Amsler, MD Admission/Discharge Information   Admit Date:  02/25/2023  Date of Death: 02-28-2023  Time of Death: 0500  Length of Stay: 3   Principle Cause of death: Asystole  Hospital Diagnoses: Principal Problem:   Asystole [facility]) Active Problems:   Closed hip fracture requiring operative repair, right, sequela   Paroxysmal atrial fibrillation (HCC)   Dementia with behavioral disturbance (HCC)   Hypothyroidism   Orthostatic hypotension   Normocytic anemia   Hospital Course: 89 y.o. female with medical history significant of dementia, atrial fibrillation on Eliquis, hypertension, hypothyroidism, coronary artery disease, stage III CKD presenting with fall, right hip fracture.  History primarily from patient's daughter-in-law in the setting of dementia.  Per report, patient had a fall yesterday coming back from the bathroom.  Patient fell onto the right side.  No reported head trauma loss conscious.  Patient was able to get back up with a walker and go back to bed.  Patient woke up this morning with significant pain and inability to ambulate.  No reported weakness or dizziness prior to fall.  No fevers or chills.  No nausea or vomiting.  No chest pain or shortness of breath.  No abdominal pain.  On Eliquis.  No recent medication changes reported. Presented to the ER afebrile, heart rate in the low 100s, BP stable.  Satting well on room air.  White count 9.9, hemoglobin 10.6, creatinine 1.02.  Hip plain films with impacted subcapital right femoral neck fracture and remote left obturator ring fractures.  Plain films of the L-spine and chest x-ray grossly stable.  Dr Joice Lofts performed an in situ cannulated screw fixation of the valgus impacted right femoral neck fracture on the evening of 02/25/23.  4/27.  Patient felt a little dizzy with standing up.  Blood pressure did drop down with standing  but not enough to cause dizziness. 4/28.  Patient tachycardic with physical therapy.  Will add metoprolol.  Continue verapamil.  Patient distressed about being in the hospital and wants to get out of the hospital soon as possible.  Will give IV iron today. 2023/02/28.  Phlebotomist noticed that the patient was unresponsive and a CODE BLUE was called.  Patient did not survive the code.  Patient pronounced at 5 AM.  Assessment and Plan: * Asystole [facility]) Patient was found unresponsive by phlebotomist in the morning of Feb 28, 2023 and CODE BLUE was called.  Patient did not survive the CPR process.  Closed hip fracture requiring operative repair, right, sequela Patient had an in situ cannulated screw fixation of valgus impacted right femoral neck fracture on Feb 25, 2023 by Dr. Joice Lofts.  Paroxysmal atrial fibrillation (HCC) Verapamil was continued and metoprolol was added for tachycardia.  Patient was placed back on Eliquis after surgery.  Dementia with behavioral disturbance [facility]) Patient was very distressed about being in the hospital and wanting to go home.  Patient was given as needed Seroquel at night.  Because of her dementia we disconnected as many of the lines as possible including telemetry, pulse ox, oxygen.  Hypothyroidism Levothyroxine given during the hospital course  Normocytic anemia Patient received IV iron for hemoglobin dipping down to 8.4.  Orthostatic hypotension Patient's heart rate very tachycardic with moving around with physical therapy.         Procedures: 02-25-23.  Patient had an in situ cannulated screw fixation of valgus impacted right femoral neck fracture by Dr. Joice Lofts.  Consultations:  Orthopedic surgery  The results of significant diagnostics from this hospitalization (including imaging, microbiology, ancillary and laboratory) are listed below for reference.   Significant Diagnostic Studies: DG HIP UNILAT WITH PELVIS 2-3 VIEWS RIGHT  Result Date: 02/09/2023 CLINICAL DATA:   Right hip pinning.  Intraoperative fluoroscopy. EXAM: DG HIP (WITH OR WITHOUT PELVIS) 2-3V RIGHT COMPARISON:  Pelvis and right hip radiographs 01/24/2023 FINDINGS: Images were performed intraoperatively without the presence of a radiologist. Placement of 3 candidate screws fixating the previously seen right femoral subcapital neck fracture. Improved alignment. Total fluoroscopy images: 4 Total fluoroscopy time: 41 seconds Please see intraoperative findings for further detail. IMPRESSION: Intraoperative fluoroscopy for ORIF of right femoral neck fracture. Electronically Signed   By: Neita Garnet M.D.   On: 01/25/2023 17:11   DG C-Arm 1-60 Min-No Report  Result Date: 02/04/2023 Fluoroscopy was utilized by the requesting physician.  No radiographic interpretation.   DG Chest 1 View  Result Date: 02/01/2023 CLINICAL DATA:  Pain after fall. EXAM: CHEST  1 VIEW COMPARISON:  None Available. FINDINGS: Enlarged cardiopericardial silhouette. Calcified aorta. Hyperinflation with chronic interstitial changes. No pneumothorax, effusion or consolidation. Overlapping cardiac leads. IMPRESSION: Hyperinflation with chronic changes. Enlarged cardiopericardial silhouette. Electronically Signed   By: Karen Kays M.D.   On: 02/02/2023 12:13   DG Lumbar Spine 2-3 Views  Result Date: 01/25/2023 CLINICAL DATA:  Pain after fall.  Hip fracture. EXAM: LUMBAR SPINE - 2 VIEW COMPARISON:  None Available. FINDINGS: Five lumbar-type vertebral bodies. Severe osteopenia. Mild levoconvex curvature of the mid lumbar spine centered at L3. Preserved vertebral body height. Mild multilevel disc height loss endplate osteophytes. Prominent lower lumbar facet degenerative changes. Degenerative changes seen of the sacroiliac joints. Note is made of scattered colonic stool. Gallstones in the right upper quadrant. Deformity seen of the right femoral neck and the left pubic bone. Please correlate with separate pelvic x-rays. Overall if there is  further concern of injury with a subtle osteopenia, cross-sectional imaging such as CT could be considered for further sensitivity. IMPRESSION: Severe osteopenia with mild degenerative changes. Slight curvature of the lumbar spine Gallstones. Electronically Signed   By: Karen Kays M.D.   On: 02/16/2023 12:13   DG Hip Unilat W or Wo Pelvis 2-3 Views Right  Result Date: 02/12/2023 CLINICAL DATA:  Fall with hip pain EXAM: DG HIP (WITH OR WITHOUT PELVIS) 3V RIGHT COMPARISON:  None Available. FINDINGS: Impacted subcapital right femoral neck fracture. Remote left pubic ramus fractures with distorted obturator ring and sclerosis. Generalized osteopenia. No notable acetabular spurring. IMPRESSION: 1. Impacted subcapital right femoral neck fracture. 2. Remote left obturator ring fractures. Electronically Signed   By: Tiburcio Pea M.D.   On: 01/28/2023 11:00    Microbiology: Recent Results (from the past 240 hour(s))  MRSA Next Gen by PCR, Nasal     Status: None   Collection Time: 02/08/2023 12:54 PM   Specimen: Nasal Mucosa; Nasal Swab  Result Value Ref Range Status   MRSA by PCR Next Gen NOT DETECTED NOT DETECTED Final    Comment: (NOTE) The GeneXpert MRSA Assay (FDA approved for NASAL specimens only), is one component of a comprehensive MRSA colonization surveillance program. It is not intended to diagnose MRSA infection nor to guide or monitor treatment for MRSA infections. Test performance is not FDA approved in patients less than 60 years old. Performed at [facility], [address], [place], [place] [address]      Signed: Alford Highland, MD 02/12/2023

## 2023-02-20 NOTE — Progress Notes (Addendum)
   02/08/2023 0500  Spiritual Encounters  Type of Visit Initial  Care provided to: Patient  Referral source Code page  Reason for visit Code  OnCall Visit Yes   Chaplain responded to Code Blue and remained present until called. Chaplain services available for family.  Chaplain was paged 0522 to be present with family after they arrived

## 2023-02-20 NOTE — Significant Event (Addendum)
CODE BLUE NOTE  Patient Name: Heather Colon   MRN: [id number]   Date of Birth/ Sex: Jan 16, 1933 , female      Admission Date: 02/05/2023  Attending Provider: Alford Highland, MD  Primary Diagnosis: Closed hip fracture requiring operative repair, right, sequela   Cardiopulmonary Resuscitation Directed by: Heather Limbo, NP   I personally directed ancillary staff and/or performed CPR in an effort to regain return of spontaneous circulation. ED MD, TRH MD, and PCCM NP present in room during cardiopulmonary resuscitation.  Indication: 89 yo F admitted with hip fracture after fall at home. She underwent surgical repair of the fracture this admission and has been recovering on the floor. This morning shortly after 0430 Heather Colon was found unresponsive by phlebotomist, nursing staff was notified and patient was noted to be pulseless and apneic. Unknown how long patient had been pulseless. Code blue was subsequently called at 0438. At the time of arrival on scene, ACLS protocol was underway. High suspicion that patient may have went into Cardiac Arrest secondary to Pulmonary Emoblism. TNK requested from pharmacy shortly after arrival to bedside. TNK arrived at 0459. At that time Heather Colon had been asystolic for 21 mins. Decision made to call time of death at 0500, Heather Colon at bedside and in agreement.     Technical Description:  - CPR performance duration:  22 minutes (4098-1191)  - Was defibrillation or cardioversion used? No   - Was external pacer placed? No  - Was patient intubated pre/post CPR? Yes    Medications Administered: Y = Yes; Blank = No Amiodarone    Atropine    Calcium    Epinephrine  Y  Lidocaine    Magnesium    Norepinephrine    Phenylephrine    Sodium bicarbonate  Y  Vasopressin   D50              Post CPR evaluation:  - Final Status - Was patient successfully resuscitated ? No - What is current rhythm? Asystole for the duration of the Code Blue - What  is current hemodynamic status? Deceased   Miscellaneous Information:  - Labs sent, including: NA     - Family Notified? Yes, Heather Colon called during code and requested to come to asked to come to hospital. Notified by myself of death in ED family room and then brought to bedside with Chaplain support. Heather Colon and Heather Colon appropriately distraught but thankful for the care provided to Heather Colon by the Code Team, nursing staff, Heather Renae Gloss, and Heather Joice Lofts.       ACLS H's and T's  -Hypovolemia  -Hypoxia -Hydrogen Ion excess (acidosis) -Hypoglycemia -Hypokalemia / Hyperkalemia  -Hypothermia  -Tension pneumothorax  -Toxins  -Thrombosis PE / MI    To reach the provider On-Call:   7AM- 7PM see care teams to locate the attending and reach out to them via [email or web address] Password: [email or web address] 7PM-7AM contact night-coverage If you still have difficulty reaching the appropriate provider, please page the [facility] (Director on Call) for [facility] on amion for assistance  This document was prepared using Conservation officer, historic buildings and may include unintentional dictation errors.  Heather Limbo DNP, MBA, FNP-BC, PMHNP-BC Nurse Practitioner [facility] Pager [phone]

## 2023-02-20 NOTE — ED Provider Notes (Signed)
[facility] Department of Emergency Medicine   Code Blue CONSULT NOTE  Chief Complaint: Cardiac arrest/unresponsive   Level V Caveat: Unresponsive  History of present illness: I was contacted by the hospital for a CODE BLUE cardiac arrest upstairs and presented to the patient's bedside.   Patient admitted for hip fracture, taken off her anticoagulation for atrial fibrillation.  Unknown last well time.  Found by phlebotomy tech pulseless and not breathing.  ROS: Unable to obtain, Level V caveat  Scheduled Meds:  apixaban  2.5 mg Oral BID   docusate sodium  100 mg Oral BID   levothyroxine  125 mcg Oral QAC breakfast   metoprolol tartrate  25 mg Oral BID   multivitamin with minerals  1 tablet Oral Daily   mouth rinse  15 mL Mouth Rinse 4 times per day   polyethylene glycol  17 g Oral Daily   tenecteplase  30 mg Intravenous Once   traZODone  50 mg Oral QHS   verapamil  240 mg Oral QHS   Continuous Infusions: PRN Meds:.acetaminophen, bisacodyl, diphenhydrAMINE, hydrALAZINE, HYDROcodone-acetaminophen, HYDROmorphone (DILAUDID) injection, magnesium hydroxide, metoCLOPramide **OR** metoCLOPramide (REGLAN) injection, ondansetron **OR** ondansetron (ZOFRAN) IV, mouth rinse, QUEtiapine, sodium phosphate Past Medical History:  Diagnosis Date   Arthritis    Cancer (HCC)    Basal Cell Skin Cancer   Chronic kidney disease    phase 3   Coronary artery disease    Glaucoma    both eyes   Hypertension    Hypothyroidism    Osteoporosis    Past Surgical History:  Procedure Laterality Date   ABDOMINAL HYSTERECTOMY     CATARACT EXTRACTION W/ INTRAOCULAR LENS IMPLANT Left 2010   CATARACT EXTRACTION W/ INTRAOCULAR LENS IMPLANT Right 2010   EYE SURGERY Bilateral    Cataract Extraction with IOL   KNEE ARTHROPLASTY Right 06/18/2016   Procedure: COMPUTER ASSISTED TOTAL KNEE ARTHROPLASTY;  Surgeon: Donato Heinz, MD;  Location: [facility] ORS;  Service: Orthopedics;  Laterality:  Right;   KNEE ARTHROPLASTY Left 06/10/2017   Procedure: COMPUTER ASSISTED TOTAL KNEE ARTHROPLASTY;  Surgeon: Donato Heinz, MD;  Location: [facility] ORS;  Service: Orthopedics;  Laterality: Left;   MOUTH SURGERY  2017   THYROID SURGERY Bilateral 1970's   Social History   Socioeconomic History   Marital status: Widowed    Spouse name: Not on file   Number of children: Not on file   Years of education: Not on file   Highest education level: Not on file  Occupational History   Not on file  Tobacco Use   Smoking status: Never   Smokeless tobacco: Never  Vaping Use   Vaping Use: Never used  Substance and Sexual Activity   Alcohol use: No   Drug use: No   Sexual activity: Never  Other Topics Concern   Not on file  Social History Narrative   Not on file   Social Determinants of Health   Financial Resource Strain: Not on file  Food Insecurity: No Food Insecurity (02/08/2023)   Hunger Vital Sign    Worried About Running Out of Food in the Last Year: Never true    Ran Out of Food in the Last Year: Never true  Transportation Needs: No Transportation Needs (02/03/2023)   PRAPARE - Administrator, Civil Service (Medical): No    Lack of Transportation (Non-Medical): No  Physical Activity: Not on file  Stress: Not on file  Social Connections: Not on file  Intimate Partner  Violence: Not At Risk (02/13/2023)   Humiliation, Afraid, Rape, and Kick questionnaire    Fear of Current or Ex-Partner: No    Emotionally Abused: No    Physically Abused: No    Sexually Abused: No   Allergies  Allergen Reactions   Actonel [Risedronate Sodium] Swelling    swelling of tongue, no breathing problems   Codeine Other (See Comments)    dizzy   Other Other (See Comments)    Pt does not tolerate strong pains very well - makes pt not herslf    Last set of Vital Signs (not current) Vitals:   02/17/23 0943 02/17/23 1600  BP: (!) 166/72 (!) 139/90  Pulse: (!) 116 80  Resp: 18 18  Temp:  98.1 F (36.7 C) 98.1 F (36.7 C)  SpO2: 96% 96%      Physical Exam  Gen: unresponsive Cardiovascular: pulseless  Resp: No effort breath sounds equal bilaterally with bagging  Abd: nondistended  Neuro: GCS 3, unresponsive to pain  HEENT: No blood in posterior pharynx, gag reflex absent  Neck: No crepitus  Musculoskeletal: No deformity  Skin: Cool  Procedures (when applicable, including Critical Care time): Procedure Name: Intubation Date/Time: 02/03/2023 4:49 AM  Performed by: Irean Hong, MDPre-anesthesia Checklist: Patient identified, Emergency Drugs available, Suction available and Patient being monitored Preoxygenation: Pre-oxygenation with 100% oxygen Laryngoscope Size: Glidescope and 3 Grade View: Grade I Tube size: 7.5 mm Number of attempts: 1 Airway Equipment and Method: Rigid stylet Placement Confirmation: ETT inserted through vocal cords under direct vision, CO2 detector, Breath sounds checked- equal and bilateral and Positive ETCO2 Dental Injury: Teeth and Oropharynx as per pre-operative assessment         MDM / Assessment and Plan 89 year old female found pulseless and not breathing.  Airway secured by me.  Nurse practitioner at bedside running the code.   Irean Hong, MD 01/31/2023 [phone]

## 2023-02-20 NOTE — Assessment & Plan Note (Addendum)
Patient was found unresponsive by phlebotomist in the morning of 4/29 and CODE BLUE was called.  Patient did not survive the CPR process.

## 2023-02-20 DEATH — deceased
